# Patient Record
Sex: Female | Born: 1975 | Hispanic: No | Smoking: Never smoker
Health system: Southern US, Community
[De-identification: ages and names within clinical notes are randomized; demographics above are authoritative.]

## PROBLEM LIST (undated history)

## (undated) DIAGNOSIS — Z789 Other specified health status: Secondary | ICD-10-CM

## (undated) DIAGNOSIS — I10 Essential (primary) hypertension: Secondary | ICD-10-CM

## (undated) HISTORY — DX: Essential (primary) hypertension: I10

## (undated) HISTORY — PX: APPENDECTOMY: SHX54

## (undated) HISTORY — PX: ANKLE SURGERY: SHX546

---

## 2008-04-27 ENCOUNTER — Other Ambulatory Visit: Admission: RE | Admit: 2008-04-27 | Discharge: 2008-04-27 | Payer: Self-pay | Admitting: Family Medicine

## 2009-01-11 ENCOUNTER — Other Ambulatory Visit: Admission: RE | Admit: 2009-01-11 | Discharge: 2009-01-11 | Payer: Self-pay | Admitting: Family Medicine

## 2010-01-18 ENCOUNTER — Other Ambulatory Visit: Admission: RE | Admit: 2010-01-18 | Discharge: 2010-01-18 | Payer: Self-pay | Admitting: *Deleted

## 2011-04-03 ENCOUNTER — Other Ambulatory Visit (HOSPITAL_COMMUNITY)
Admission: RE | Admit: 2011-04-03 | Discharge: 2011-04-03 | Disposition: A | Discharge status: Home / Self Care | Payer: BC Managed Care – PPO | Source: Ambulatory Visit | Attending: Internal Medicine | Admitting: Internal Medicine

## 2011-04-03 ENCOUNTER — Other Ambulatory Visit: Payer: Self-pay

## 2011-04-03 DIAGNOSIS — Z01419 Encounter for gynecological examination (general) (routine) without abnormal findings: Secondary | ICD-10-CM | POA: Insufficient documentation

## 2011-06-16 ENCOUNTER — Other Ambulatory Visit: Payer: Self-pay | Admitting: Family Medicine

## 2011-06-16 ENCOUNTER — Ambulatory Visit
Admission: RE | Admit: 2011-06-16 | Discharge: 2011-06-16 | Disposition: A | Discharge status: Home / Self Care | Payer: BC Managed Care – PPO | Source: Ambulatory Visit | Attending: Family Medicine | Admitting: Family Medicine

## 2011-06-16 DIAGNOSIS — R609 Edema, unspecified: Secondary | ICD-10-CM

## 2011-06-16 DIAGNOSIS — X501XXA Overexertion from prolonged static or awkward postures, initial encounter: Secondary | ICD-10-CM

## 2011-06-16 DIAGNOSIS — R52 Pain, unspecified: Secondary | ICD-10-CM

## 2012-07-08 ENCOUNTER — Other Ambulatory Visit: Payer: Self-pay | Admitting: Family Medicine

## 2012-07-08 ENCOUNTER — Other Ambulatory Visit (HOSPITAL_COMMUNITY)
Admission: RE | Admit: 2012-07-08 | Discharge: 2012-07-08 | Disposition: A | Discharge status: Home / Self Care | Payer: BC Managed Care – PPO | Source: Ambulatory Visit | Attending: Family Medicine | Admitting: Family Medicine

## 2012-07-08 DIAGNOSIS — Z0000 Encounter for general adult medical examination without abnormal findings: Secondary | ICD-10-CM | POA: Insufficient documentation

## 2014-09-25 ENCOUNTER — Other Ambulatory Visit (HOSPITAL_COMMUNITY)
Admission: RE | Admit: 2014-09-25 | Discharge: 2014-09-25 | Disposition: A | Discharge status: Home / Self Care | Payer: BC Managed Care – PPO | Source: Ambulatory Visit | Attending: Family | Admitting: Family

## 2014-09-25 ENCOUNTER — Other Ambulatory Visit: Payer: Self-pay

## 2014-09-25 DIAGNOSIS — Z1151 Encounter for screening for human papillomavirus (HPV): Secondary | ICD-10-CM | POA: Diagnosis present

## 2014-09-25 DIAGNOSIS — Z01411 Encounter for gynecological examination (general) (routine) with abnormal findings: Secondary | ICD-10-CM | POA: Diagnosis present

## 2014-09-27 LAB — CYTOLOGY - PAP

## 2016-06-12 ENCOUNTER — Encounter: Payer: Self-pay | Admitting: Obstetrics & Gynecology

## 2017-01-29 ENCOUNTER — Other Ambulatory Visit: Payer: Self-pay | Admitting: Physician Assistant

## 2017-04-01 ENCOUNTER — Ambulatory Visit (INDEPENDENT_AMBULATORY_CARE_PROVIDER_SITE_OTHER): Payer: 59 | Admitting: Obstetrics & Gynecology

## 2017-04-01 ENCOUNTER — Encounter: Payer: Self-pay | Admitting: Obstetrics & Gynecology

## 2017-04-01 VITALS — BP 120/76 | Ht 65.0 in | Wt 143.0 lb

## 2017-04-01 DIAGNOSIS — Z9189 Other specified personal risk factors, not elsewhere classified: Secondary | ICD-10-CM | POA: Diagnosis not present

## 2017-04-01 DIAGNOSIS — Z01419 Encounter for gynecological examination (general) (routine) without abnormal findings: Secondary | ICD-10-CM | POA: Diagnosis not present

## 2017-04-01 DIAGNOSIS — Z1151 Encounter for screening for human papillomavirus (HPV): Secondary | ICD-10-CM

## 2017-04-01 NOTE — Patient Instructions (Signed)
1. Encounter for routine gynecological examination with Papanicolaou smear of cervix Normal Gyn exam.  H/O LGSIL.  Pap/HPV done.  Will schedule screening Mammo 06/2017 at [facility].  2. Relies on partner vasectomy for contraception    Zoe Fuller, it was a pleasure to see you today!  I will inform you of your results as soon as available.

## 2017-04-01 NOTE — Addendum Note (Signed)
Addended by: Thurnell Garbe A on: 04/01/2017 09:56 AM   Modules accepted: Orders

## 2017-04-01 NOTE — Progress Notes (Signed)
Zoe Fuller October 14, 1976 [id number]   History:    41 y.o.  G2P2 remarried x 3 yrs.  Vasectomy.  Children 10 and 22 yo.  2 step children as well  RP:  Established patient presenting for annual gyn exam   Saw an Integrative [facility] physician who Dxed and Rxed her for systemic Candida.  Feeling better now.  Menses qmonth flow heavier for a while, now back to normal.  No pelvic pain.  Breasts wnl.  H/O LGSIL.  Past medical history,surgical history, family history and social history were all reviewed and documented in the EPIC chart.  Gynecologic History Patient's last menstrual period was 03/08/2017. Contraception: vasectomy Last Pap: 06/2016. Results were: normal, HPV HR neg Last mammogram: 06/2016. Results were: normal  Obstetric History OB History  Gravida Para Term Preterm AB Living  2 2       2   SAB TAB Ectopic Multiple Live Births               # Outcome Date GA Lbr Len/2nd Weight Sex Delivery Anes PTL Lv  2 Para           1 Para                ROS: A ROS was performed and pertinent positives and negatives are included in the history.  GENERAL: No fevers or chills. HEENT: No change in vision, no earache, sore throat or sinus congestion. NECK: No pain or stiffness. CARDIOVASCULAR: No chest pain or pressure. No palpitations. PULMONARY: No shortness of breath, cough or wheeze. GASTROINTESTINAL: No abdominal pain, nausea, vomiting or diarrhea, melena or bright red blood per rectum. GENITOURINARY: No urinary frequency, urgency, hesitancy or dysuria. MUSCULOSKELETAL: No joint or muscle pain, no back pain, no recent trauma. DERMATOLOGIC: No rash, no itching, no lesions. ENDOCRINE: No polyuria, polydipsia, no heat or cold intolerance. No recent change in weight. HEMATOLOGICAL: No anemia or easy bruising or bleeding. NEUROLOGIC: No headache, seizures, numbness, tingling or weakness. PSYCHIATRIC: No depression, no loss of interest in normal activity or change in sleep pattern.     Exam:   BP 120/76   Ht 5\' 5"  (1.651 m)   Wt 143 lb (64.9 kg)   LMP 03/08/2017   BMI 23.80 kg/m   Body mass index is 23.8 kg/m.  General appearance : Well developed well nourished female. No acute distress HEENT: Eyes: no retinal hemorrhage or exudates,  Neck supple, trachea midline, no carotid bruits, no thyroidmegaly Lungs: Clear to auscultation, no rhonchi or wheezes, or rib retractions  Heart: Regular rate and rhythm, no murmurs or gallops Breast:Examined in sitting and supine position were symmetrical in appearance, no palpable masses or tenderness,  no skin retraction, no nipple inversion, no nipple discharge, no skin discoloration, no axillary or supraclavicular lymphadenopathy Abdomen: no palpable masses or tenderness, no rebound or guarding Extremities: no edema or skin discoloration or tenderness  Pelvic:  Bartholin, Urethra, Skene Glands: Within normal limits             Vagina: No gross lesions or discharge  Cervix: No gross lesions or discharge.  Pap/HPV done.  Uterus  RV, normal size, shape and consistency, non-tender and mobile  Adnexa  Without masses or tenderness  Anus and perineum  normal    Assessment/Plan:  41 y.o. female for annual exam  1. Encounter for routine gynecological examination with Papanicolaou smear of cervix Normal Gyn exam.  H/O LGSIL.  Pap/HPV done.  [facility]  2. Relies on partner vasectomy for contraception    Princess Bruins MD, 8:48 AM 04/01/2017

## 2017-04-07 LAB — PAP, TP IMAGING W/ HPV RNA, RFLX HPV TYPE 16,18/45: HPV MRNA, HIGH RISK: NOT DETECTED

## 2017-04-08 ENCOUNTER — Encounter: Payer: Self-pay | Admitting: Obstetrics & Gynecology

## 2017-04-30 ENCOUNTER — Encounter: Payer: Self-pay | Admitting: Obstetrics & Gynecology

## 2017-06-03 ENCOUNTER — Other Ambulatory Visit: Payer: Self-pay | Admitting: Obstetrics & Gynecology

## 2017-06-03 DIAGNOSIS — Z1231 Encounter for screening mammogram for malignant neoplasm of breast: Secondary | ICD-10-CM

## 2017-06-17 ENCOUNTER — Ambulatory Visit
Admission: RE | Admit: 2017-06-17 | Discharge: 2017-06-17 | Disposition: A | Discharge status: Home / Self Care | Payer: 59 | Source: Ambulatory Visit | Attending: Obstetrics & Gynecology | Admitting: Obstetrics & Gynecology

## 2017-06-17 DIAGNOSIS — Z1231 Encounter for screening mammogram for malignant neoplasm of breast: Secondary | ICD-10-CM

## 2018-04-02 ENCOUNTER — Ambulatory Visit (INDEPENDENT_AMBULATORY_CARE_PROVIDER_SITE_OTHER): Payer: BLUE CROSS/BLUE SHIELD | Admitting: Obstetrics & Gynecology

## 2018-04-02 ENCOUNTER — Encounter: Payer: Self-pay | Admitting: Obstetrics & Gynecology

## 2018-04-02 VITALS — BP 120/78 | Ht 65.75 in | Wt 135.0 lb

## 2018-04-02 DIAGNOSIS — Z01419 Encounter for gynecological examination (general) (routine) without abnormal findings: Secondary | ICD-10-CM

## 2018-04-02 DIAGNOSIS — Z9189 Other specified personal risk factors, not elsewhere classified: Secondary | ICD-10-CM | POA: Diagnosis not present

## 2018-04-02 NOTE — Addendum Note (Signed)
Addended by: Thurnell Garbe A on: 04/02/2018 09:42 AM   Modules accepted: Orders

## 2018-04-02 NOTE — Patient Instructions (Signed)
1. Encounter for routine gynecological examination with Papanicolaou smear of cervix Normal gynecologic exam.  Pap reflex done.  History of low-grade SIL, but last year's Pap test was negative with negative high risk HPV.  Breast exam normal.  Last mammogram negative in August 2018.  Good body mass index at 21.96.  Continue with regular physical activity and good nutrition.  First blood pressure today was borderline increased, but second blood pressure normal at 120/78.  Health labs with integrative medicine.  Patient will establish with a family physician.  2. Relies on partner vasectomy for contraception  Malayiah, it was a pleasure seeing you today!  I will inform you of your results as soon as they are available.

## 2018-04-02 NOTE — Progress Notes (Signed)
Zoe Fuller [birth year] [id number]   History:    42 y.o. G2P2L2 Remarried x 4 years.  Vasectomy.  Children 68 and 91 yo.  Has 2 stepchildren as well.  RP:  Established patient presenting for annual gyn exam   HPI: Menses regular normal every month.  No pelvic pain.  H/O LGSIL, but last Pap negative/HPV HR neg 03/2017.  Normal vaginal secretions.  No pain with intercourse.  Urine and bowel movements normal.  Breasts normal.  Body mass index 21.96.  Fit and eating well.  Seeing an integrative medicine physician.  Labs showed low vitamin D.  Patient taking supplements.  Will find a family physician as well.  Past medical history,surgical history, family history and social history were all reviewed and documented in the EPIC chart.  Gynecologic History Patient's last menstrual period was 03/12/2018. Contraception: vasectomy Last Pap: 03/2017. Results were: Negative/HPV HR neg Last mammogram: 06/2017. Results were: Negative Bone Density: Never Colonoscopy: Never  Obstetric History OB History  Gravida Para Term Preterm AB Living  2 2       2   SAB TAB Ectopic Multiple Live Births               # Outcome Date GA Lbr Len/2nd Weight Sex Delivery Anes PTL Lv  2 Para           1 Para              ROS: A ROS was performed and pertinent positives and negatives are included in the history.  GENERAL: No fevers or chills. HEENT: No change in vision, no earache, sore throat or sinus congestion. NECK: No pain or stiffness. CARDIOVASCULAR: No chest pain or pressure. No palpitations. PULMONARY: No shortness of breath, cough or wheeze. GASTROINTESTINAL: No abdominal pain, nausea, vomiting or diarrhea, melena or bright red blood per rectum. GENITOURINARY: No urinary frequency, urgency, hesitancy or dysuria. MUSCULOSKELETAL: No joint or muscle pain, no back pain, no recent trauma. DERMATOLOGIC: No rash, no itching, no lesions. ENDOCRINE: No polyuria, polydipsia, no heat or cold intolerance. No  recent change in weight. HEMATOLOGICAL: No anemia or easy bruising or bleeding. NEUROLOGIC: No headache, seizures, numbness, tingling or weakness. PSYCHIATRIC: No depression, no loss of interest in normal activity or change in sleep pattern.     Exam:   BP (!) 146/88   Ht 5' 5.75" (1.67 m)   Wt 135 lb (61.2 kg)   LMP 03/12/2018 Comment: pt's husband with vasectomy  BMI 21.96 kg/m   Body mass index is 21.96 kg/m.  General appearance : Well developed well nourished female. No acute distress HEENT: Eyes: no retinal hemorrhage or exudates,  Neck supple, trachea midline, no carotid bruits, no thyroidmegaly Lungs: Clear to auscultation, no rhonchi or wheezes, or rib retractions  Heart: Regular rate and rhythm, no murmurs or gallops Breast:Examined in sitting and supine position were symmetrical in appearance, no palpable masses or tenderness,  no skin retraction, no nipple inversion, no nipple discharge, no skin discoloration, no axillary or supraclavicular lymphadenopathy Abdomen: no palpable masses or tenderness, no rebound or guarding Extremities: no edema or skin discoloration or tenderness  Pelvic: Vulva: Normal             Vagina: No gross lesions or discharge  Cervix: No gross lesions or discharge.  Pap reflex done.  Uterus  RV, normal size, shape and consistency, non-tender and mobile  Adnexa  Without masses or tenderness  Anus: Normal   Assessment/Plan:  42 y.o. female for  annual exam   1. Encounter for routine gynecological examination with Papanicolaou smear of cervix Normal gynecologic exam.  Pap reflex done.  History of low-grade SIL, but last year's Pap test was negative with negative high risk HPV.  Breast exam normal.  Last mammogram negative in August 2018.  Good body mass index at 21.96.  Continue with regular physical activity and good nutrition.  First blood pressure today was borderline increased, but second blood pressure normal at 120/78.  Health labs with  integrative medicine.  Patient will establish with a family physician.  2. Relies on partner vasectomy for contraception   Princess Bruins MD, 9:08 AM 04/02/2018

## 2018-04-06 LAB — PAP IG W/ RFLX HPV ASCU

## 2018-06-18 ENCOUNTER — Other Ambulatory Visit: Payer: Self-pay | Admitting: Family Medicine

## 2018-06-18 ENCOUNTER — Other Ambulatory Visit: Payer: Self-pay | Admitting: Obstetrics & Gynecology

## 2018-06-18 DIAGNOSIS — Z1231 Encounter for screening mammogram for malignant neoplasm of breast: Secondary | ICD-10-CM

## 2018-07-20 ENCOUNTER — Ambulatory Visit
Admission: RE | Admit: 2018-07-20 | Discharge: 2018-07-20 | Disposition: A | Discharge status: Home / Self Care | Payer: 59 | Source: Ambulatory Visit | Attending: Obstetrics & Gynecology | Admitting: Obstetrics & Gynecology

## 2018-07-20 DIAGNOSIS — Z1231 Encounter for screening mammogram for malignant neoplasm of breast: Secondary | ICD-10-CM

## 2019-04-05 ENCOUNTER — Other Ambulatory Visit: Payer: Self-pay

## 2019-04-06 ENCOUNTER — Encounter: Payer: Self-pay | Admitting: Obstetrics & Gynecology

## 2019-04-06 ENCOUNTER — Ambulatory Visit (INDEPENDENT_AMBULATORY_CARE_PROVIDER_SITE_OTHER): Payer: BLUE CROSS/BLUE SHIELD | Admitting: Obstetrics & Gynecology

## 2019-04-06 VITALS — BP 120/70 | Ht 65.5 in | Wt 136.5 lb

## 2019-04-06 DIAGNOSIS — R87612 Low grade squamous intraepithelial lesion on cytologic smear of cervix (LGSIL): Secondary | ICD-10-CM

## 2019-04-06 DIAGNOSIS — Z9189 Other specified personal risk factors, not elsewhere classified: Secondary | ICD-10-CM

## 2019-04-06 DIAGNOSIS — Z01419 Encounter for gynecological examination (general) (routine) without abnormal findings: Secondary | ICD-10-CM

## 2019-04-06 NOTE — Progress Notes (Signed)
Zoe Fuller Jun 18, 1976 [id number]   History:    43 y.o. G2P2L2 Married x 5 yrs. Vasectomy.  Children 15 and 61 yo.  2 stepchildren/grand-children.  RP:  Established patient presenting for annual gyn exam   HPI: Menses regular normal, with lighter menstrual flow.  No BTB.  Followed by [facility].  Taking supplements of Iron/Vit B12/Vit K/Vit C/Vit D/Mg++/Acetylcysteine/Cyanocobalamin/Pregnenolone.  No pelvic pain.  No pain with IC.  Vasectomy.  Urine/BMs normal.  Breasts normal.  BMI 22.37.  Enjoys hiking.  Health labs with Fam MD/Integrative [facility].  Past medical history,surgical history, family history and social history were all reviewed and documented in the EPIC chart.  Gynecologic History Patient's last menstrual period was 03/28/2019. Contraception: vasectomy Last Pap: 03/2018. Results were: Negative, but TZ cells absent. Last mammogram: 07/2018. Results were: Negative Bone Density: Never Colonoscopy: Never  Obstetric History OB History  Gravida Para Term Preterm AB Living  2 2       2   SAB TAB Ectopic Multiple Live Births               # Outcome Date GA Lbr Len/2nd Weight Sex Delivery Anes PTL Lv  2 Para           1 Para              ROS: A ROS was performed and pertinent positives and negatives are included in the history.  GENERAL: No fevers or chills. HEENT: No change in vision, no earache, sore throat or sinus congestion. NECK: No pain or stiffness. CARDIOVASCULAR: No chest pain or pressure. No palpitations. PULMONARY: No shortness of breath, cough or wheeze. GASTROINTESTINAL: No abdominal pain, nausea, vomiting or diarrhea, melena or bright red blood per rectum. GENITOURINARY: No urinary frequency, urgency, hesitancy or dysuria. MUSCULOSKELETAL: No joint or muscle pain, no back pain, no recent trauma. DERMATOLOGIC: No rash, no itching, no lesions. ENDOCRINE: No polyuria, polydipsia, no heat or cold intolerance. No recent change in weight. HEMATOLOGICAL: No  anemia or easy bruising or bleeding. NEUROLOGIC: No headache, seizures, numbness, tingling or weakness. PSYCHIATRIC: No depression, no loss of interest in normal activity or change in sleep pattern.     Exam:   BP 120/70   Ht 5' 5.5" (1.664 m)   Wt 136 lb 8 oz (61.9 kg)   LMP 03/28/2019   BMI 22.37 kg/m   Body mass index is 22.37 kg/m.  General appearance : Well developed well nourished female. No acute distress HEENT: Eyes: no retinal hemorrhage or exudates,  Neck supple, trachea midline, no carotid bruits, no thyroidmegaly Lungs: Clear to auscultation, no rhonchi or wheezes, or rib retractions  Heart: Regular rate and rhythm, no murmurs or gallops Breast:Examined in sitting and supine position were symmetrical in appearance, no palpable masses or tenderness,  no skin retraction, no nipple inversion, no nipple discharge, no skin discoloration, no axillary or supraclavicular lymphadenopathy Abdomen: no palpable masses or tenderness, no rebound or guarding Extremities: no edema or skin discoloration or tenderness  Pelvic: Vulva: Normal             Vagina: No gross lesions or discharge  Cervix: No gross lesions or discharge.  Pap reflex done.  Uterus  RV, normal size, shape and consistency, non-tender and mobile  Adnexa  Without masses or tenderness  Anus: Normal   Assessment/Plan:  43 y.o. female for annual exam   1. Encounter for routine gynecological examination with Papanicolaou smear of cervix Normal gynecologic exam.  Pap test in  May 2019 was negative but transition zone cells were absent, Pap reflex done today.  Breast exam normal.  Screening mammogram September 2019 was negative.  Very good body mass index at 22.37.  Continue with fitness and healthy nutrition.  Health labs with family physician.  2. Relies on partner vasectomy for contraception  3. 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  Pap reflex done today.  Other orders - levocetirizine (XYZAL) 5  MG tablet; Take 5 mg by mouth every evening.  Princess Bruins MD, 4:40 PM 04/06/2019

## 2019-04-07 ENCOUNTER — Encounter: Payer: BLUE CROSS/BLUE SHIELD | Admitting: Obstetrics & Gynecology

## 2019-04-08 ENCOUNTER — Encounter: Payer: Self-pay | Admitting: Obstetrics & Gynecology

## 2019-04-08 NOTE — Patient Instructions (Signed)
1. Encounter for routine gynecological examination with Papanicolaou smear of cervix Normal gynecologic exam.  Pap test in May 2019 was negative but transition zone cells were absent, Pap reflex done today.  Breast exam normal.  Screening mammogram September 2019 was negative.  Very good body mass index at 22.37.  Continue with fitness and healthy nutrition.  Health labs with family physician.  2. Relies on partner vasectomy for contraception  3. 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  Pap reflex done today.  Other orders - levocetirizine (XYZAL) 5 MG tablet; Take 5 mg by mouth every evening.  Zoe Fuller, it was a pleasure seeing you today!  I will inform you of your results as soon as they are available.

## 2019-04-12 LAB — PAP IG W/ RFLX HPV ASCU

## 2019-04-12 LAB — HUMAN PAPILLOMAVIRUS, HIGH RISK: HPV DNA High Risk: NOT DETECTED

## 2019-09-05 ENCOUNTER — Other Ambulatory Visit: Payer: Self-pay | Admitting: Obstetrics & Gynecology

## 2019-09-05 DIAGNOSIS — Z1231 Encounter for screening mammogram for malignant neoplasm of breast: Secondary | ICD-10-CM

## 2019-10-26 ENCOUNTER — Ambulatory Visit
Admission: RE | Admit: 2019-10-26 | Discharge: 2019-10-26 | Disposition: A | Discharge status: Home / Self Care | Payer: BC Managed Care – PPO | Source: Ambulatory Visit | Attending: Obstetrics & Gynecology | Admitting: Obstetrics & Gynecology

## 2019-10-26 ENCOUNTER — Other Ambulatory Visit: Payer: Self-pay

## 2019-10-26 DIAGNOSIS — Z1231 Encounter for screening mammogram for malignant neoplasm of breast: Secondary | ICD-10-CM

## 2020-02-23 ENCOUNTER — Other Ambulatory Visit: Payer: Self-pay

## 2020-02-23 MED ORDER — ALCLOMETASONE DIPROPIONATE 0.05 % EX CREA: TOPICAL_CREAM | Freq: Two times a day (BID) | CUTANEOUS | 8 refills | Status: DC | PRN

## 2020-04-05 ENCOUNTER — Other Ambulatory Visit: Payer: Self-pay

## 2020-04-06 ENCOUNTER — Encounter: Payer: Self-pay | Admitting: Obstetrics & Gynecology

## 2020-04-06 ENCOUNTER — Ambulatory Visit (INDEPENDENT_AMBULATORY_CARE_PROVIDER_SITE_OTHER): Payer: PRIVATE HEALTH INSURANCE | Admitting: Obstetrics & Gynecology

## 2020-04-06 VITALS — BP 120/70 | Ht 66.0 in | Wt 141.0 lb

## 2020-04-06 DIAGNOSIS — N926 Irregular menstruation, unspecified: Secondary | ICD-10-CM | POA: Diagnosis not present

## 2020-04-06 DIAGNOSIS — Z9189 Other specified personal risk factors, not elsewhere classified: Secondary | ICD-10-CM

## 2020-04-06 DIAGNOSIS — Z01419 Encounter for gynecological examination (general) (routine) without abnormal findings: Secondary | ICD-10-CM

## 2020-04-06 DIAGNOSIS — R87610 Atypical squamous cells of undetermined significance on cytologic smear of cervix (ASC-US): Secondary | ICD-10-CM | POA: Diagnosis not present

## 2020-04-06 NOTE — Patient Instructions (Signed)
  1. Encounter for routine gynecological examination with Papanicolaou smear of cervix Normal gynecologic exam.  ASCUS with negative high-risk HPV in May 2020, pap reflex done today.  Breast exam normal.  Screening mammogram December 2020 was negative.  Will do health labs with integrative medicine.  Very good body mass index at 22.76.  Continue with fitness and healthy nutrition.  2. ASCUS of cervix with negative high risk HPV Pap reflex done today.  3. Relies on partner vasectomy for contraception  4. Irregular menses Irregular menses with metrorrhagia for 3 cycles.  Will rule out thyroid dysfunction and hyperprolactinemia.  Follow-up with a pelvic ultrasound to rule out endometrial pathology such as polyps, fibroids endometrial hyperplasia and endometrial cancer. - TSH - Prolactin - US Transvaginal Non-OB; Future  Zoe Fuller, it was a pleasure seeing you today!  I will inform you of your results as soon as they are available.

## 2020-04-06 NOTE — Progress Notes (Signed)
Zoe Fuller [birth year] [id number]   History:    44 y.o. G2P2L2 Married x 6 yrs. Vasectomy.  Children 16 and 50 yo.  2 stepchildren/grand-children.  RP:  Established patient presenting for annual gyn exam   HPI: Menses monthly with light flow, but BTB x 3 months.  Followed by [facility].  Stopped supplements of Iron/Vit B12/Vit K/Vit C/Vit D/Mg++/Acetylcysteine/Cyanocobalamin/Pregnenolone.  No pelvic pain.  No pain with IC. Vasectomy.  Urine/BMs normal.  Breasts normal.  BMI 22.37.  Enjoys hiking.  Health labs with Fam MD/[facility]ative [facility].   Past medical history,surgical history, family history and social history were all reviewed and documented in the EPIC chart.  Gynecologic History Patient's last menstrual period was 03/23/2020.  Obstetric History OB History  Gravida Para Term Preterm AB Living  2 2       2   SAB TAB Ectopic Multiple Live Births               # Outcome Date GA Lbr Len/2nd Weight Sex Delivery Anes PTL Lv  2 Para           1 Para              ROS: A ROS was performed and pertinent positives and negatives are included in the history.  GENERAL: No fevers or chills. HEENT: No change in vision, no earache, sore throat or sinus congestion. NECK: No pain or stiffness. CARDIOVASCULAR: No chest pain or pressure. No palpitations. PULMONARY: No shortness of breath, cough or wheeze. GASTROINTESTINAL: No abdominal pain, nausea, vomiting or diarrhea, melena or bright red blood per rectum. GENITOURINARY: No urinary frequency, urgency, hesitancy or dysuria. MUSCULOSKELETAL: No joint or muscle pain, no back pain, no recent trauma. DERMATOLOGIC: No rash, no itching, no lesions. ENDOCRINE: No polyuria, polydipsia, no heat or cold intolerance. No recent change in weight. HEMATOLOGICAL: No anemia or easy bruising or bleeding. NEUROLOGIC: No headache, seizures, numbness, tingling or weakness. PSYCHIATRIC: No depression, no loss of interest in normal activity or change in  sleep pattern.     Exam:   04/06/20 8:12 AM    BP  120/70   Weight  141lb(64kg)   Height  5'6"(1.627m)   Other Vitals  BMI 22.76 kg/m2  BSA 1.73 m2  LMP 03/23/2020  Menstrual status: Having periods  OB/Gyn status reviewed 04/06/2020       General appearance : Well developed well nourished female. No acute distress HEENT: Eyes: no retinal hemorrhage or exudates,  Neck supple, trachea midline, no carotid bruits, no thyroidmegaly Lungs: Clear to auscultation, no rhonchi or wheezes, or rib retractions  Heart: Regular rate and rhythm, no murmurs or gallops Breast:Examined in sitting and supine position were symmetrical in appearance, no palpable masses or tenderness,  no skin retraction, no nipple inversion, no nipple discharge, no skin discoloration, no axillary or supraclavicular lymphadenopathy Abdomen: no palpable masses or tenderness, no rebound or guarding Extremities: no edema or skin discoloration or tenderness  Pelvic: Vulva: Normal             Vagina: No gross lesions or discharge  Cervix: No gross lesions or discharge.  Pap reflex done.  Uterus  AV, normal size, shape and consistency, non-tender and mobile  Adnexa  Without masses or tenderness  Anus: Normal   Assessment/Plan:  44 y.o. female for annual exam   1. Encounter for routine gynecological examination with Papanicolaou smear of cervix Normal gynecologic exam.  ASCUS with negative high-risk HPV in May 2020, pap reflex done today.  Breast exam normal.  Screening mammogram December 2020 was negative.  Will do health labs with integrative medicine.  Very good body mass index at 22.76.  Continue with fitness and healthy nutrition.  2. ASCUS of cervix with negative high risk HPV Pap reflex done today.  3. Relies on partner vasectomy for contraception  4. Irregular menses Irregular menses with metrorrhagia for 3 cycles.  Will rule out thyroid dysfunction and hyperprolactinemia.  Follow-up with a pelvic  ultrasound to rule out endometrial pathology such as polyps, fibroids endometrial hyperplasia and endometrial cancer. - TSH - Prolactin - US Transvaginal Non-OB; Future  Princess Bruins MD, 8:21 AM 04/06/2020

## 2020-04-07 LAB — PROLACTIN: Prolactin: 14.7 ng/mL

## 2020-04-07 LAB — TSH: TSH: 1.32 mIU/L

## 2020-04-16 LAB — HUMAN PAPILLOMAVIRUS, HIGH RISK: HPV DNA High Risk: DETECTED — AB

## 2020-04-16 LAB — PAP IG W/ RFLX HPV ASCU

## 2020-04-25 ENCOUNTER — Other Ambulatory Visit: Payer: Self-pay

## 2020-04-26 ENCOUNTER — Ambulatory Visit (INDEPENDENT_AMBULATORY_CARE_PROVIDER_SITE_OTHER): Payer: PRIVATE HEALTH INSURANCE | Admitting: Obstetrics & Gynecology

## 2020-04-26 ENCOUNTER — Ambulatory Visit (INDEPENDENT_AMBULATORY_CARE_PROVIDER_SITE_OTHER): Payer: PRIVATE HEALTH INSURANCE

## 2020-04-26 DIAGNOSIS — D219 Benign neoplasm of connective and other soft tissue, unspecified: Secondary | ICD-10-CM | POA: Diagnosis not present

## 2020-04-26 DIAGNOSIS — R87610 Atypical squamous cells of undetermined significance on cytologic smear of cervix (ASC-US): Secondary | ICD-10-CM

## 2020-04-26 DIAGNOSIS — D251 Intramural leiomyoma of uterus: Secondary | ICD-10-CM

## 2020-04-26 DIAGNOSIS — R87810 Cervical high risk human papillomavirus (HPV) DNA test positive: Secondary | ICD-10-CM | POA: Diagnosis not present

## 2020-04-26 DIAGNOSIS — N854 Malposition of uterus: Secondary | ICD-10-CM

## 2020-04-26 DIAGNOSIS — N926 Irregular menstruation, unspecified: Secondary | ICD-10-CM

## 2020-04-26 DIAGNOSIS — D252 Subserosal leiomyoma of uterus: Secondary | ICD-10-CM

## 2020-04-26 NOTE — Progress Notes (Signed)
    Kerly Mensah-Wilson Jul 19, 1976 [id number]        44 y.o.  G2P2L2 Married x 6 yrs.  Vasectomy.  RP: Irregular menses for Pelvic US  HPI: Menses monthly with light flow, but BTB x 3 months.  Not bleeding today.  No pelvic pain currently.  No abnormal vaginal d/c.  No fever.  Pap 03/2020 ASCUS/HPV HR Positive.   OB History  Gravida Para Term Preterm AB Living  2 2       2   SAB TAB Ectopic Multiple Live Births               # Outcome Date GA Lbr Len/2nd Weight Sex Delivery Anes PTL Lv  2 Para           1 Para             Past medical history,surgical history, problem list, medications, allergies, family history and social history were all reviewed and documented in the EPIC chart.   Directed ROS with pertinent positives and negatives documented in the history of present illness/assessment and plan.  Exam:  There were no vitals filed for this visit. General appearance:  Normal  Pelvic US today: T/V images.  Retroverted uterus mildly enlarged and bulky with multiple fibroids, intramural and subserosal.  The uterus is measured at 7.41 x 6.33 x 2.33 cm.  5 fibroids are measured which are all below 2 cm.  The endometrial lining is thin and symmetrical, measured at 2.89 mm with no mass or thickening seen.  Both ovaries are normal in size with no follicles visualized.  No adnexal mass.  No free fluid in the posterior cul-de-sac.  Pap 04/06/2020 ASCUS/HPV HR Positive   Assessment/Plan:  44 y.o. G2P2   1. Irregular menses Pelvic ultrasound findings thoroughly reviewed with patient.  Small fibroids present but normal thin endometrial lining measured at 2.89 mm.  Ovaries normal.  Patient reassured.  Decision to observe.  2. Fibroids Small intramural and subserosal myoma.  3. ASCUS with positive high risk HPV cervical Pap test results discussed with patient.  Significance of high risk HPV positive reviewed.  Follow-up for colposcopy.  Procedure explained.  Princess Bruins MD,  9:19 AM 04/26/2020

## 2020-04-30 ENCOUNTER — Encounter: Payer: Self-pay | Admitting: Obstetrics & Gynecology

## 2020-04-30 NOTE — Patient Instructions (Signed)
1. Irregular menses Pelvic ultrasound findings thoroughly reviewed with patient.  Small fibroids present but normal thin endometrial lining measured at 2.89 mm.  Ovaries normal.  Patient reassured.  Decision to observe.  2. Fibroids Small intramural and subserosal myoma.  3. ASCUS with positive high risk HPV cervical Pap test results discussed with patient.  Significance of high risk HPV positive reviewed.  Follow-up for colposcopy.  Procedure explained.  Zoe Fuller, it was a pleasure seeing you today!

## 2020-05-15 ENCOUNTER — Ambulatory Visit (INDEPENDENT_AMBULATORY_CARE_PROVIDER_SITE_OTHER): Payer: PRIVATE HEALTH INSURANCE | Admitting: Obstetrics & Gynecology

## 2020-05-15 ENCOUNTER — Encounter: Payer: Self-pay | Admitting: Obstetrics & Gynecology

## 2020-05-15 ENCOUNTER — Other Ambulatory Visit: Payer: Self-pay

## 2020-05-15 VITALS — BP 130/88

## 2020-05-15 DIAGNOSIS — R87810 Cervical high risk human papillomavirus (HPV) DNA test positive: Secondary | ICD-10-CM

## 2020-05-15 DIAGNOSIS — N870 Mild cervical dysplasia: Secondary | ICD-10-CM | POA: Diagnosis not present

## 2020-05-15 NOTE — Progress Notes (Signed)
    Zoe Fuller 02-13-1976 [id number]        44 y.o.  G2P2   RP: ASCUS/HPV HR positive for Colposcopy  HPI: Pap 04/06/2020 ASCUS/HPV HR positive.  Pap ASCUS with HPV HR negative in 2020.   OB History  Gravida Para Term Preterm AB Living  2 2       2   SAB TAB Ectopic Multiple Live Births               # Outcome Date GA Lbr Len/2nd Weight Sex Delivery Anes PTL Lv  2 Para           1 Para             Past medical history,surgical history, problem list, medications, allergies, family history and social history were all reviewed and documented in the EPIC chart.   Directed ROS with pertinent positives and negatives documented in the history of present illness/assessment and plan.  Exam:  Vitals:   05/15/20 1554  BP: 130/88   General appearance:  Normal  Colposcopy Procedure Note Zoe Fuller 05/15/2020  Indications:  ASCUS/HPV HR positive  Procedure Details  The risks and benefits of the procedure and Verbal informed consent obtained.  Speculum placed in vagina and excellent visualization of cervix achieved, cervix swabbed x 3 with acetic acid solution.  Findings:  Cervix colposcopy: Physical Exam Genitourinary:       Vaginal colposcopy: Normal  Vulvar colposcopy: Normal  Perirectal colposcopy: Normal  The cervix was sprayed with Hurricane before performing the cervical biopsies.  Specimens: HPV 16-18-45.  Cervical Bxs at 1, 5, 8 and 11 O'Clock.  Complications:  None, Silver Nitrate for hemostasis. . Plan:  Management per HPV 16-18-45 and Cervical Bx results.    Assessment/Plan:  44 y.o. G2P2   1. ASCUS with positive high risk HPV cervical ASCUS with high risk HPV positive in May 2021.  Colposcopy procedure reviewed and colposcopy findings discussed with patient.  Postprocedure precautions reviewed.  Management per HPV 16-18-45 and cervical pathology results. - HPV Type 16 and 18/45 RNA - Pathology Report  ([facility])  Princess Bruins MD, 44 4:12 PM 05/15/2020

## 2020-05-16 ENCOUNTER — Encounter: Payer: Self-pay | Admitting: Obstetrics & Gynecology

## 2020-05-16 NOTE — Patient Instructions (Signed)
1. ASCUS with positive high risk HPV cervical ASCUS with high risk HPV positive in May 2021.  Colposcopy procedure reviewed and colposcopy findings discussed with patient.  Postprocedure precautions reviewed.  Management per HPV 16-18-45 and cervical pathology results. - HPV Type 16 and 18/45 RNA - Pathology Report ([facility])  Zoe Fuller, it was a pleasure seeing you today!  I will inform you of your results as soon as they are available.

## 2020-05-18 LAB — TISSUE PATH REPORT

## 2020-05-18 LAB — PATHOLOGY REPORT

## 2020-05-23 LAB — HPV TYPE 16 AND 18/45 RNA
HPV Type 16 RNA: NOT DETECTED
HPV Type 18/45 RNA: NOT DETECTED

## 2021-03-06 ENCOUNTER — Other Ambulatory Visit: Payer: Self-pay | Admitting: Physician Assistant

## 2021-09-13 IMAGING — MG DIGITAL SCREENING BILAT W/ TOMO W/ CAD
8 series · 9 of 24 positions shown · non-contrast
Comparison: Previous exam(s).

CLINICAL DATA: Screening.

EXAM:
DIGITAL SCREENING BILATERAL MAMMOGRAM WITH TOMO AND CAD

[L CC synth-2D]
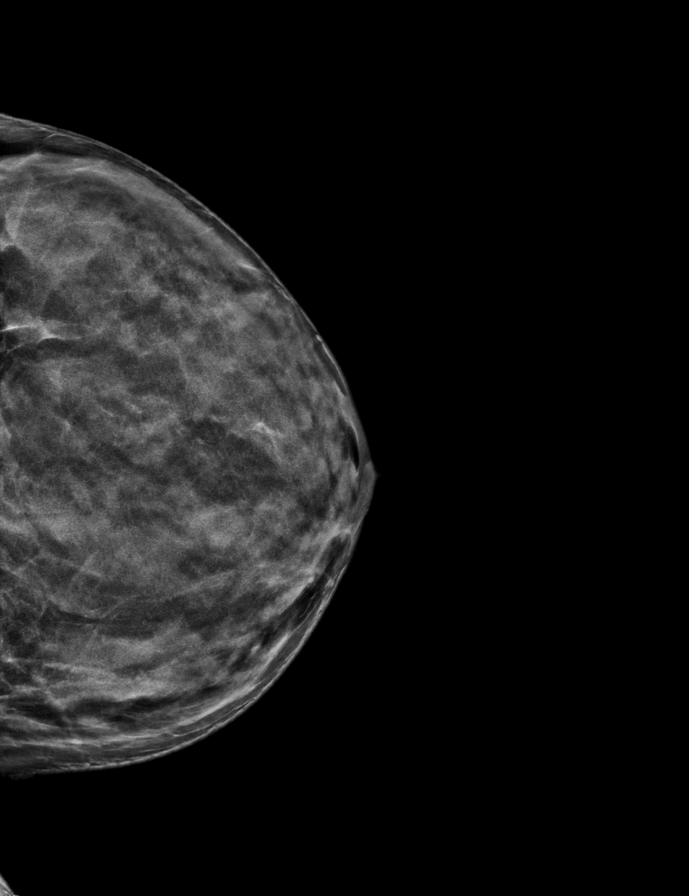

[R MLO synth-2D]
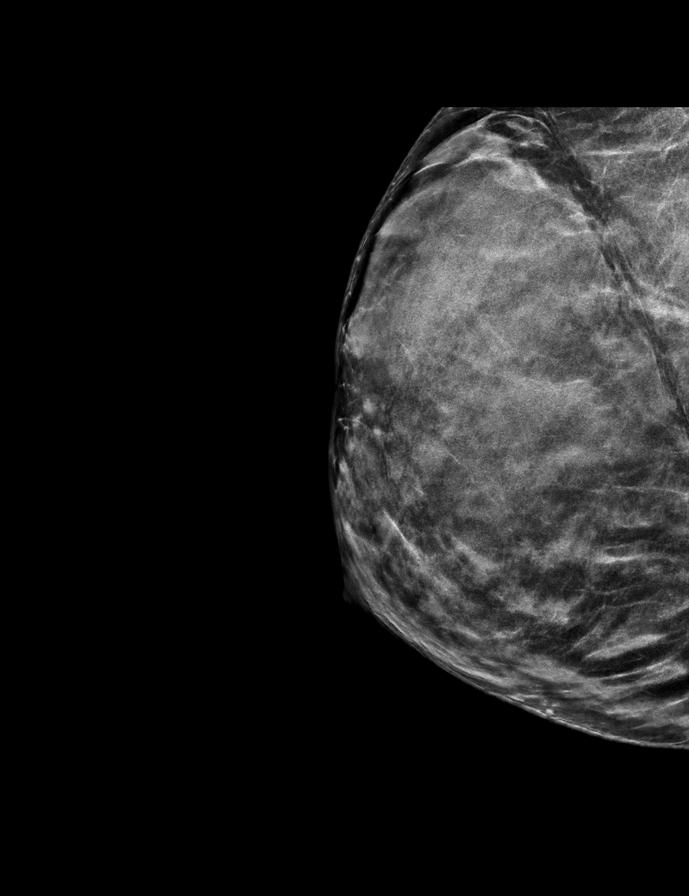

[R CC synth-2D]
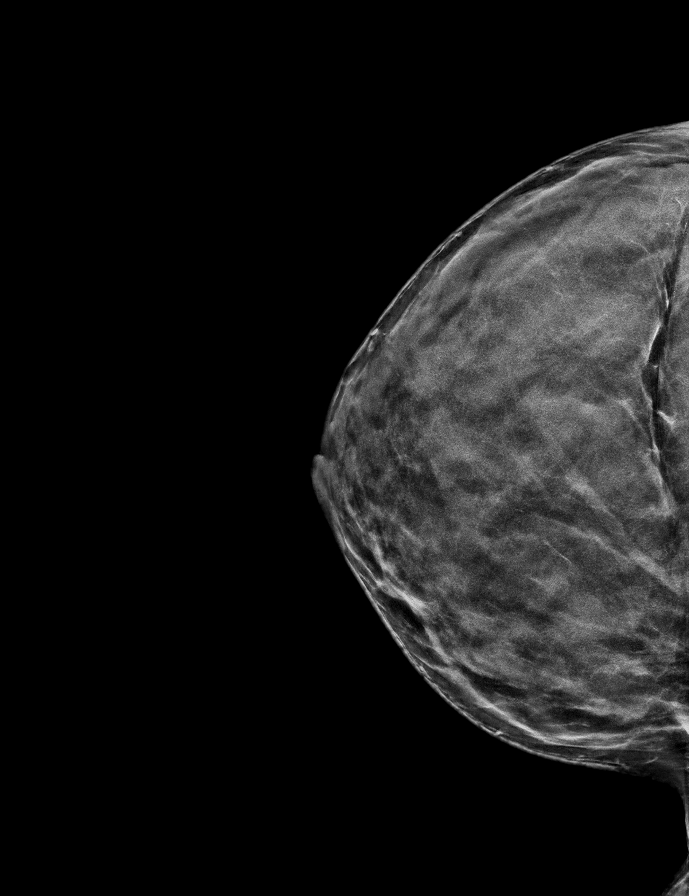

[L MLO synth-2D]
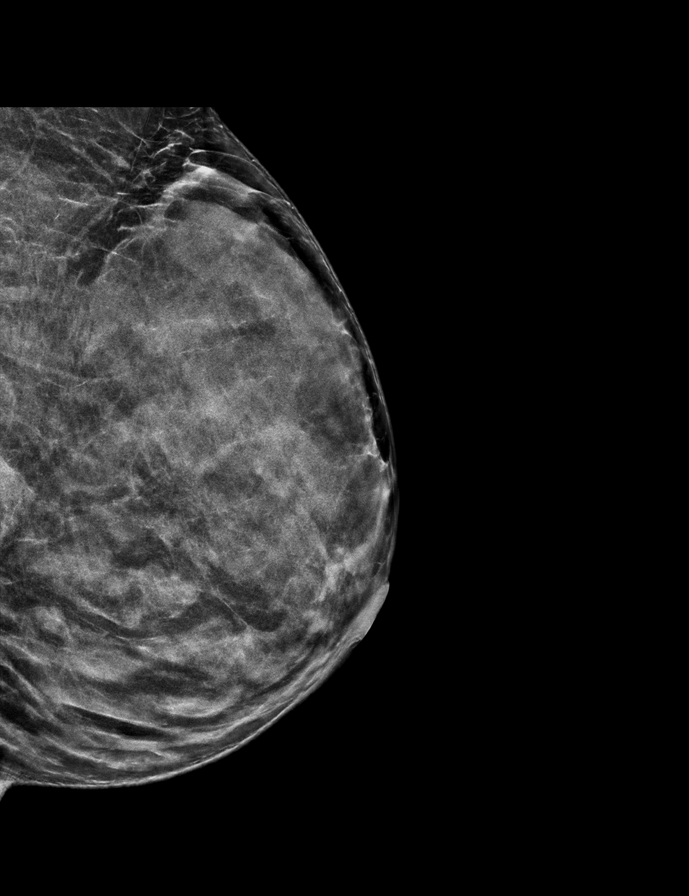

[R MLO tomo · 2 of 52 frames shown]
[frame 17/52]
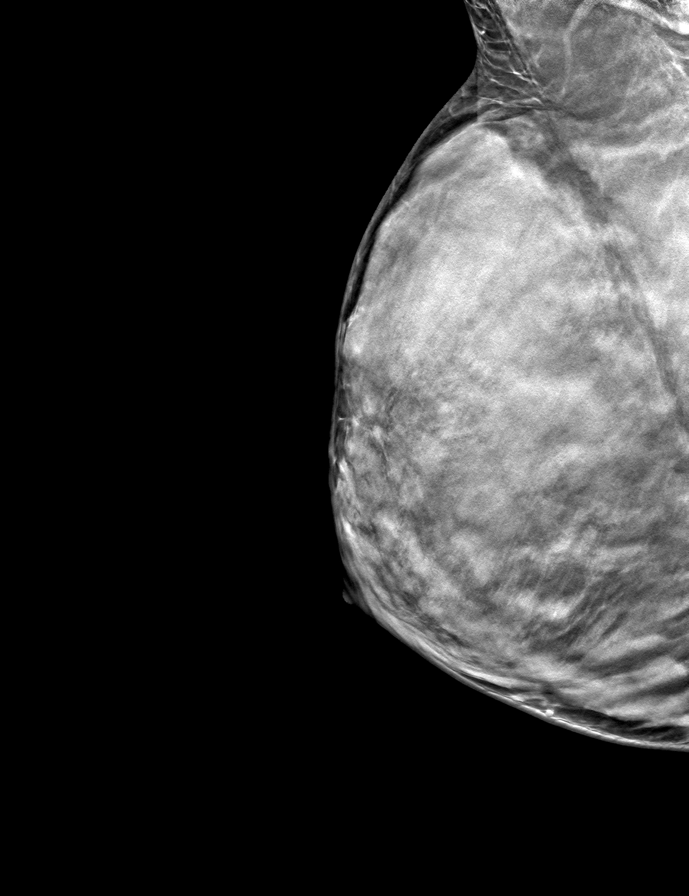
[frame 27/52]
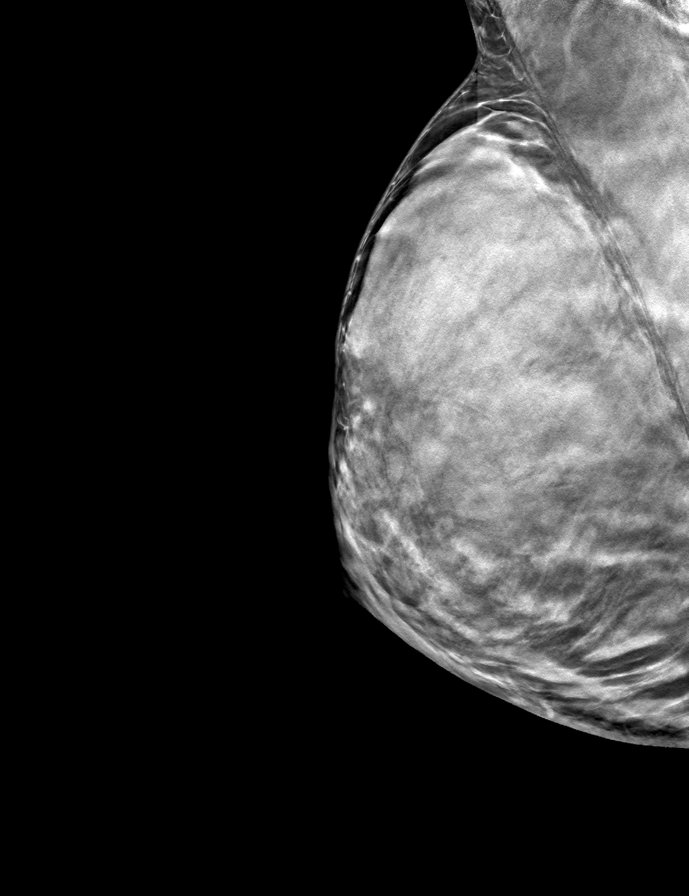

[R CC tomo · tomo slice 29/58.0]
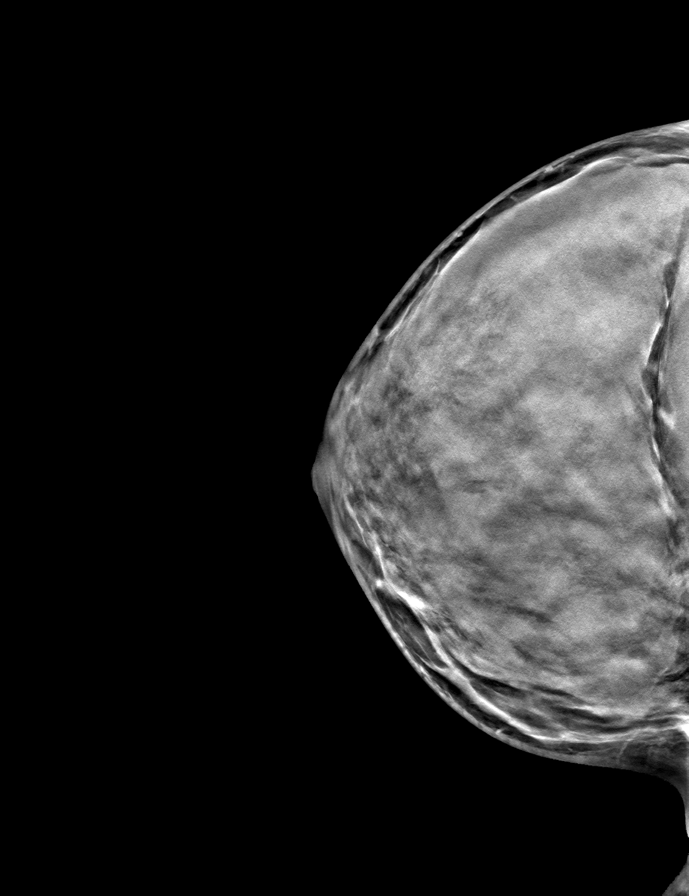

[L CC tomo · tomo slice 26/51.0]
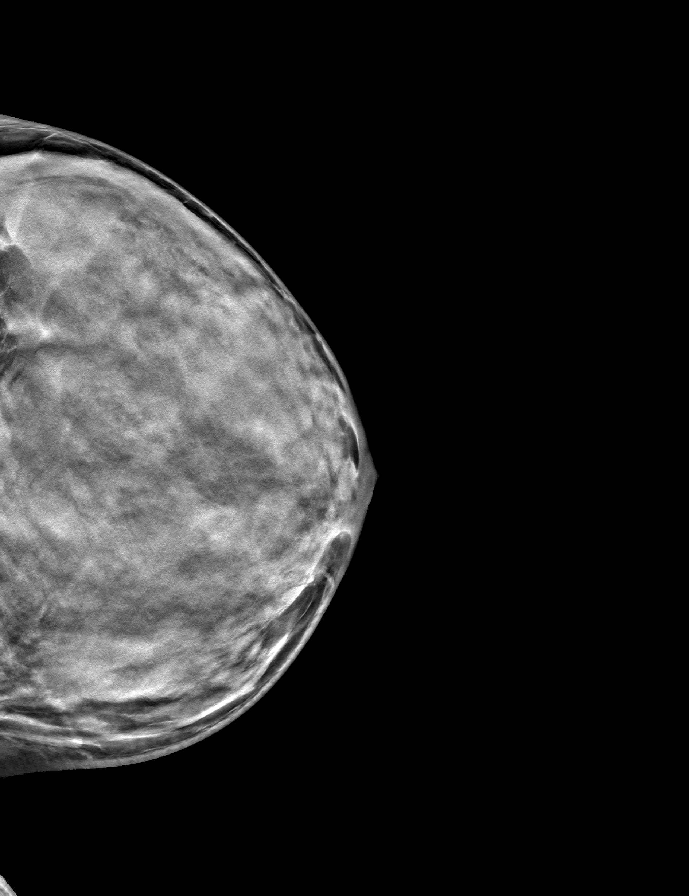

[L MLO tomo · tomo slice 25/50.0]
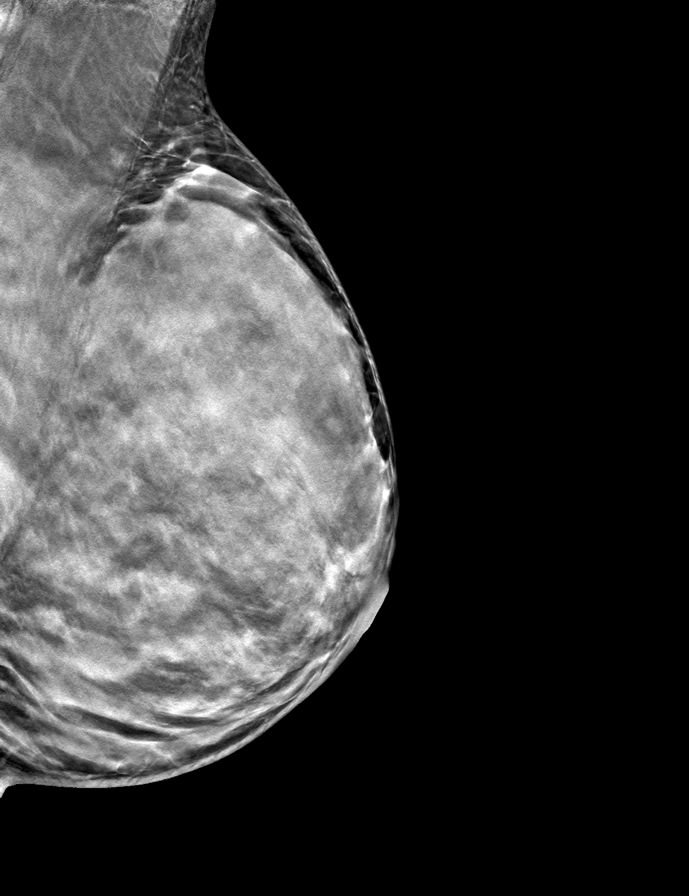

[9 of 24 positions shown; findings below may reference images not displayed]

ACR Breast Density Category d: The breast tissue is extremely dense,
which lowers the sensitivity of mammography
FINDINGS: There are no findings suspicious for malignancy. Images were
processed with CAD.
IMPRESSION: No mammographic evidence of malignancy. A result letter of this
screening mammogram will be mailed directly to the patient.

RECOMMENDATION:
Screening mammogram in one year. (Code:WO-[id number])

BI-RADS CATEGORY  1: Negative.

## 2021-12-03 ENCOUNTER — Encounter: Payer: Self-pay | Admitting: Physician Assistant

## 2021-12-03 ENCOUNTER — Ambulatory Visit (INDEPENDENT_AMBULATORY_CARE_PROVIDER_SITE_OTHER): Payer: No Typology Code available for payment source | Admitting: Physician Assistant

## 2021-12-03 ENCOUNTER — Other Ambulatory Visit: Payer: Self-pay

## 2021-12-03 DIAGNOSIS — L720 Epidermal cyst: Secondary | ICD-10-CM

## 2021-12-03 DIAGNOSIS — L308 Other specified dermatitis: Secondary | ICD-10-CM

## 2021-12-03 DIAGNOSIS — L708 Other acne: Secondary | ICD-10-CM | POA: Diagnosis not present

## 2021-12-03 DIAGNOSIS — L309 Dermatitis, unspecified: Secondary | ICD-10-CM

## 2021-12-03 DIAGNOSIS — D485 Neoplasm of uncertain behavior of skin: Secondary | ICD-10-CM

## 2021-12-03 MED ORDER — LEVOCETIRIZINE DIHYDROCHLORIDE 5 MG PO TABS: 5.0000 mg | ORAL_TABLET | Freq: Every evening | ORAL | 11 refills | Status: AC

## 2021-12-03 MED ORDER — TACROLIMUS 0.1 % EX OINT: TOPICAL_OINTMENT | Freq: Every day | CUTANEOUS | 11 refills | Status: DC

## 2021-12-03 NOTE — Patient Instructions (Signed)

## 2021-12-04 ENCOUNTER — Encounter: Payer: Self-pay | Admitting: Physician Assistant

## 2021-12-04 NOTE — Progress Notes (Signed)
New Patient   Subjective  Zoe Fuller is a 46 y.o. female who presents for the following: New Patient (Initial Visit) (Patient here today with daughter to have some blackheads removed on her back and face x years. No drainage, no bleeding, no pain, some itching at times.  Patient would also like to discuss thing to do for aging. No personal history or family history of atypical moles, melanoma or non mole skin cancer. ).   The following portions of the chart were reviewed this encounter and updated as appropriate:  Tobacco   Allergies   Meds   Problems   Med Hx   Surg Hx   Fam Hx       Objective  Well appearing patient in no apparent distress; mood and affect are within normal limits.  All skin waist up examined.  Mid Upper Back Nodule with central enlarged pore.   Right Mid Back Nodule with central enlarged pore.   Left Lower Cutaneous Lip Nodule with central enlarged pore.   Right Zygomatic Area Nodule with central enlarged pore.   Left Lower Eyelid, Left Upper Eyelid, Right Lower Eyelid, Right Upper Eyelid Xerosis and hyperpigmentation.    Assessment & Plan  Neoplasm of uncertain behavior of skin (4) Mid Upper Back  Skin / nail biopsy Type of biopsy: punch   Informed consent: discussed and consent obtained   Timeout: patient name, date of birth, surgical site, and procedure verified   Procedure prep:  Patient was prepped and draped in usual sterile fashion (Non sterile) Prep type:  Chlorhexidine Anesthesia: the lesion was anesthetized in a standard fashion   Anesthetic:  1% lidocaine w/ epinephrine 1-100,000 local infiltration Punch size:  3 mm Suture size:  4-0 Suture removal (days):  10 Hemostasis achieved with: suture   Outcome: patient tolerated procedure well   Post-procedure details: sterile dressing applied and wound care instructions given   Dressing type: bandage and petrolatum    Specimen 1 - Surgical pathology Differential Diagnosis:  R/O Cyst  Check Margins: No  Right Mid Back  Skin / nail biopsy Type of biopsy: punch   Informed consent: discussed and consent obtained   Timeout: patient name, date of birth, surgical site, and procedure verified   Procedure prep:  Patient was prepped and draped in usual sterile fashion (Non sterile) Prep type:  Chlorhexidine Anesthesia: the lesion was anesthetized in a standard fashion   Anesthetic:  1% lidocaine w/ epinephrine 1-100,000 local infiltration Punch size:  3 mm Suture size:  4-0 Suture removal (days):  10 Hemostasis achieved with: suture   Outcome: patient tolerated procedure well   Post-procedure details: sterile dressing applied and wound care instructions given   Dressing type: bandage and petrolatum    Specimen 2 - Surgical pathology Differential Diagnosis: R/O Cyst  Check Margins: No  Left Lower Cutaneous Lip  Skin / nail biopsy Type of biopsy: punch   Informed consent: discussed and consent obtained   Timeout: patient name, date of birth, surgical site, and procedure verified   Procedure prep:  Patient was prepped and draped in usual sterile fashion (Non sterile) Prep type:  Chlorhexidine Anesthesia: the lesion was anesthetized in a standard fashion   Anesthetic:  1% lidocaine w/ epinephrine 1-100,000 local infiltration Punch size:  3 mm Suture size:  5-0 Suture removal (days):  10 Hemostasis achieved with: suture   Outcome: patient tolerated procedure well   Post-procedure details: sterile dressing applied and wound care instructions given   Dressing type:  bandage and petrolatum    Specimen 3 - Surgical pathology Differential Diagnosis: R/O Cyst  Check Margins: No  Right Zygomatic Area  Skin / nail biopsy Type of biopsy: punch   Informed consent: discussed and consent obtained   Timeout: patient name, date of birth, surgical site, and procedure verified   Procedure prep:  Patient was prepped and draped in usual sterile fashion (Non  sterile) Prep type:  Chlorhexidine Anesthesia: the lesion was anesthetized in a standard fashion   Anesthetic:  1% lidocaine w/ epinephrine 1-100,000 local infiltration Punch size:  3 mm Suture size:  5-0 Suture removal (days):  10 Hemostasis achieved with: suture   Outcome: patient tolerated procedure well   Post-procedure details: sterile dressing applied and wound care instructions given   Dressing type: bandage and petrolatum    Specimen 4 - Surgical pathology Differential Diagnosis: R/O Cyst  Check Margins: No  Periocular dermatitis Left Upper Eyelid; Right Upper Eyelid; Left Lower Eyelid; Right Lower Eyelid  tacrolimus (PROTOPIC) 0.1 % ointment - Left Lower Eyelid, Left Upper Eyelid, Right Lower Eyelid, Right Upper Eyelid Apply topically at bedtime.  levocetirizine (XYZAL) 5 MG tablet - Left Lower Eyelid, Left Upper Eyelid, Right Lower Eyelid, Right Upper Eyelid Take 1 tablet (5 mg total) by mouth every evening.     I, Marsella Suman, PA-C, have reviewed all documentation's for this visit.  The documentation on 12/04/21 for the exam, diagnosis, procedures and orders are all accurate and complete.

## 2021-12-10 ENCOUNTER — Telehealth: Payer: Self-pay | Admitting: Physician Assistant

## 2021-12-10 DIAGNOSIS — L309 Dermatitis, unspecified: Secondary | ICD-10-CM

## 2021-12-10 NOTE — Telephone Encounter (Signed)
Patient left message on office voice mail that the ointment tacrolimus tacrolimus (PROTOPIC) 0.1 % ointment Was too expensive and patient wants to know what she needs to do.

## 2021-12-10 NOTE — Telephone Encounter (Signed)
[facility] cash pay 46.75 [facility] with [employer]

## 2021-12-11 ENCOUNTER — Telehealth: Payer: Self-pay | Admitting: Physician Assistant

## 2021-12-11 MED ORDER — TACROLIMUS 0.1 % EX OINT: TOPICAL_OINTMENT | Freq: Every day | CUTANEOUS | 11 refills | Status: DC

## 2021-12-11 NOTE — Addendum Note (Signed)
Addended by: Sheran Lawless on: 12/11/2021 10:49 AM   Modules accepted: Orders

## 2021-12-11 NOTE — Telephone Encounter (Signed)
Patient calling to inform clinical staff that she does want the rx for protopic sent to [facility]. Confirmed that she just needs coupon code from [facility] and to cash pay for this medication.

## 2021-12-11 NOTE — Telephone Encounter (Signed)
Phone call to patient to inform her that her prescription has already been sent to [facility] and she'll just need to tell the Pharmacist that she's using [facility] and paying cash.  Patient aware.

## 2021-12-11 NOTE — Telephone Encounter (Signed)
Patient aware rx sent to [facility]

## 2021-12-12 ENCOUNTER — Other Ambulatory Visit: Payer: Self-pay

## 2021-12-12 ENCOUNTER — Ambulatory Visit (INDEPENDENT_AMBULATORY_CARE_PROVIDER_SITE_OTHER): Payer: No Typology Code available for payment source

## 2021-12-12 DIAGNOSIS — Z4802 Encounter for removal of sutures: Secondary | ICD-10-CM

## 2021-12-12 MED ORDER — TACROLIMUS 0.1 % EX OINT: TOPICAL_OINTMENT | CUTANEOUS | 11 refills | Status: DC

## 2021-12-12 NOTE — Progress Notes (Signed)
Nurse visit x 4 suture removed no signs or symptoms of infection

## 2021-12-12 NOTE — Addendum Note (Signed)
Addended by: Sheran Lawless on: 12/12/2021 09:09 AM   Modules accepted: Orders

## 2021-12-17 ENCOUNTER — Telehealth: Payer: Self-pay | Admitting: Physician Assistant

## 2021-12-17 NOTE — Telephone Encounter (Signed)
Needs day supply of Tacrolimus for insurance

## 2021-12-17 NOTE — Telephone Encounter (Signed)
30 days supply per tube

## 2022-02-05 ENCOUNTER — Ambulatory Visit (INDEPENDENT_AMBULATORY_CARE_PROVIDER_SITE_OTHER): Payer: No Typology Code available for payment source | Admitting: Physician Assistant

## 2022-02-05 ENCOUNTER — Other Ambulatory Visit: Payer: Self-pay

## 2022-02-05 DIAGNOSIS — L720 Epidermal cyst: Secondary | ICD-10-CM

## 2022-02-11 ENCOUNTER — Ambulatory Visit: Payer: No Typology Code available for payment source

## 2022-02-24 ENCOUNTER — Encounter: Payer: Self-pay | Admitting: Physician Assistant

## 2022-02-24 NOTE — Progress Notes (Signed)
? ?  Follow-Up Visit ?  ?Subjective  ?Zoe Fuller is a 46 y.o. female who presents for the following: Skin Problem (Lesion on left side chin has come back. We removed it in January. Was an epidermoid cyst). ? ? ?The following portions of the chart were reviewed this encounter and updated as appropriate:  Tobacco  Allergies  Meds  Problems  Med Hx  Surg Hx  Fam Hx   ?  ? ?Objective  ?Well appearing patient in no apparent distress; mood and affect are within normal limits. ? ?A focused examination was performed including face. Relevant physical exam findings are noted in the Assessment and Plan. ? ?chin ?Small papule ? ? ?Assessment & Plan  ?Epidermal cyst ?chin ? ?observe ? ? ? ?I, Olevia Westervelt, PA-C, have reviewed all documentation's for this visit.  The documentation on 02/24/22 for the exam, diagnosis, procedures and orders are all accurate and complete. ?

## 2022-11-27 ENCOUNTER — Other Ambulatory Visit: Payer: Self-pay | Admitting: Internal Medicine

## 2022-11-27 DIAGNOSIS — Z1231 Encounter for screening mammogram for malignant neoplasm of breast: Secondary | ICD-10-CM

## 2023-01-16 ENCOUNTER — Ambulatory Visit
Admission: RE | Admit: 2023-01-16 | Discharge: 2023-01-16 | Disposition: A | Discharge status: Home / Self Care | Payer: No Typology Code available for payment source | Source: Ambulatory Visit | Attending: Internal Medicine | Admitting: Internal Medicine

## 2023-01-16 DIAGNOSIS — Z1231 Encounter for screening mammogram for malignant neoplasm of breast: Secondary | ICD-10-CM

## 2023-02-18 ENCOUNTER — Other Ambulatory Visit (HOSPITAL_COMMUNITY)
Admission: RE | Admit: 2023-02-18 | Discharge: 2023-02-18 | Disposition: A | Discharge status: Home / Self Care | Payer: No Typology Code available for payment source | Source: Ambulatory Visit | Attending: Obstetrics & Gynecology | Admitting: Obstetrics & Gynecology

## 2023-02-18 ENCOUNTER — Ambulatory Visit (INDEPENDENT_AMBULATORY_CARE_PROVIDER_SITE_OTHER): Payer: No Typology Code available for payment source | Admitting: Obstetrics & Gynecology

## 2023-02-18 ENCOUNTER — Encounter: Payer: Self-pay | Admitting: Obstetrics & Gynecology

## 2023-02-18 VITALS — BP 118/74 | HR 71 | Ht 65.25 in | Wt 160.0 lb

## 2023-02-18 DIAGNOSIS — Z113 Encounter for screening for infections with a predominantly sexual mode of transmission: Secondary | ICD-10-CM | POA: Diagnosis present

## 2023-02-18 DIAGNOSIS — Z3009 Encounter for other general counseling and advice on contraception: Secondary | ICD-10-CM

## 2023-02-18 DIAGNOSIS — Z01419 Encounter for gynecological examination (general) (routine) without abnormal findings: Secondary | ICD-10-CM

## 2023-02-18 NOTE — Progress Notes (Signed)
Zoe Fuller December 25, 1975 [id number]   History:    47 y.o. G2P2L2 Process of divorce. 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  Daughters 95 and 15+ yo.    RP:  Established patient presenting for annual gyn exam    HPI: Menses mostly monthly with light flow, occasionally spaces to 2 months, and then the flow is moderate.  No BTB.  No pelvic pain.  Abstinent x 04/2022.  Pap Neg 03/2020.  Pap/HPV HR, Gono-Chlam today. Breasts normal.  Mammo Neg 01/2023.  Colono 05/2022. BMI 26.42.  Lifting weights.  Followed by [facility].     Past medical history,surgical history, family history and social history were all reviewed and documented in the EPIC chart.  Gynecologic History Patient's last menstrual period was 01/06/2023.  Obstetric History OB History  Gravida Para Term Preterm AB Living  2 2 2     2   SAB IAB Ectopic Multiple Live Births               # Outcome Date GA Lbr Len/2nd Weight Sex Delivery Anes PTL Lv  2 Term           1 Term              ROS: A ROS was performed and pertinent positives and negatives are included in the history. GENERAL: No fevers or chills. HEENT: No change in vision, no earache, sore throat or sinus congestion. NECK: No pain or stiffness. CARDIOVASCULAR: No chest pain or pressure. No palpitations. PULMONARY: No shortness of breath, cough or wheeze. GASTROINTESTINAL: No abdominal pain, nausea, vomiting or diarrhea, melena or bright red blood per rectum. GENITOURINARY: No urinary frequency, urgency, hesitancy or dysuria. MUSCULOSKELETAL: No joint or muscle pain, no back pain, no recent trauma. DERMATOLOGIC: No rash, no itching, no lesions. ENDOCRINE: No polyuria, polydipsia, no heat or cold intolerance. No recent change in weight. HEMATOLOGICAL: No anemia or easy bruising or bleeding. NEUROLOGIC: No headache, seizures, numbness, tingling or weakness. PSYCHIATRIC: No depression, no loss of interest in normal activity or change in sleep pattern.     Exam:   BP  118/74   Pulse 71   Ht 5' 5.25" (1.657 m)   Wt 160 lb (72.6 kg)   LMP 01/06/2023 Comment: spotting march 16,2024 for 1 day, not sexually active since 6/23  SpO2 99%   BMI 26.42 kg/m   Body mass index is 26.42 kg/m.  General appearance : Well developed well nourished female. No acute distress HEENT: Eyes: no retinal hemorrhage or exudates,  Neck supple, trachea midline, no carotid bruits, no thyroidmegaly Lungs: Clear to auscultation, no rhonchi or wheezes, or rib retractions  Heart: Regular rate and rhythm, no murmurs or gallops Breast:Examined in sitting and supine position were symmetrical in appearance, no palpable masses or tenderness,  no skin retraction, no nipple inversion, no nipple discharge, no skin discoloration, no axillary or supraclavicular lymphadenopathy Abdomen: no palpable masses or tenderness, no rebound or guarding Extremities: no edema or skin discoloration or tenderness  Pelvic: Vulva: Normal             Vagina: No gross lesions or discharge  Cervix: No gross lesions or discharge.  Pap/HPV HR, Gono-Chlam.  Uterus  AV, normal size, shape and consistency, non-tender and mobile  Adnexa  Without masses or tenderness  Anus: Normal   Assessment/Plan:  47 y.o. female for annual exam   1. Encounter for routine gynecological examination with Papanicolaou smear of cervix Menses mostly monthly with light flow, occasionally  spaces to 2 months, and then the flow is moderate.  No BTB.  No pelvic pain.  Abstinent x 04/2022.  Pap Neg 03/2020.  Pap/HPV HR, Gono-Chlam today. Breasts normal.  Mammo Neg 01/2023.  Colono 05/2022. BMI 26.42.  Lifting weights.  Followed by [facility].   - Cytology - PAP( [place])  2. Encounter for other general counseling or advice on contraception Currently abstinent, declines contraception.  3. Screen for STD (sexually transmitted disease) - Cytology - PAP( [place]) with HPV HR, Gono-Chlam.  Other orders - Ferrous Sulfate (IRON PO);  Take by mouth. - Cyanocobalamin (B-12 PO); Take by mouth. - Cholecalciferol (VITAMIN D3 PO); Take by mouth. - Prasterone, DHEA, (DHEA PO); Take 5 mg by mouth. - Pregnenolone Micronized (PREGNENOLONE PO); Take by mouth. - CHLOROPHYLL PO; Take by mouth. - COLLAGEN PO; Take by mouth. - UNABLE TO FIND; Med Name: sea moss - FENUGREEK PO; Take by mouth.   Genia Del MD, 8:21 AM

## 2023-02-19 LAB — CYTOLOGY - PAP
Chlamydia: NEGATIVE
Comment: NEGATIVE
Comment: NEGATIVE
Comment: NORMAL
Diagnosis: NEGATIVE
High risk HPV: NEGATIVE
Neisseria Gonorrhea: NEGATIVE

## 2023-12-16 ENCOUNTER — Other Ambulatory Visit: Payer: Self-pay | Admitting: Internal Medicine

## 2023-12-16 DIAGNOSIS — Z1231 Encounter for screening mammogram for malignant neoplasm of breast: Secondary | ICD-10-CM

## 2024-01-04 ENCOUNTER — Emergency Department (HOSPITAL_COMMUNITY): Payer: Self-pay

## 2024-01-04 ENCOUNTER — Encounter (HOSPITAL_COMMUNITY): Payer: Self-pay

## 2024-01-04 ENCOUNTER — Emergency Department (HOSPITAL_COMMUNITY)
Admission: EM | Admit: 2024-01-04 | Discharge: 2024-01-04 | Disposition: A | Discharge status: Home / Self Care | Payer: Self-pay | Attending: Emergency Medicine | Admitting: Emergency Medicine

## 2024-01-04 ENCOUNTER — Other Ambulatory Visit: Payer: Self-pay

## 2024-01-04 DIAGNOSIS — I10 Essential (primary) hypertension: Secondary | ICD-10-CM | POA: Diagnosis not present

## 2024-01-04 DIAGNOSIS — Z79899 Other long term (current) drug therapy: Secondary | ICD-10-CM | POA: Diagnosis not present

## 2024-01-04 DIAGNOSIS — R55 Syncope and collapse: Secondary | ICD-10-CM | POA: Diagnosis present

## 2024-01-04 HISTORY — DX: Other specified health status: Z78.9

## 2024-01-04 LAB — BASIC METABOLIC PANEL
Anion gap: 8 (ref 5–15)
BUN: 10 mg/dL (ref 6–20)
CO2: 26 mmol/L (ref 22–32)
Calcium: 9.6 mg/dL (ref 8.9–10.3)
Chloride: 103 mmol/L (ref 98–111)
Creatinine, Ser: 1.06 mg/dL — ABNORMAL HIGH (ref 0.44–1.00)
GFR, Estimated: >60 mL/min (ref >60)
Glucose, Bld: 107 mg/dL — ABNORMAL HIGH (ref 70–99)
Potassium: 3.8 mmol/L (ref 3.5–5.1)
Sodium: 137 mmol/L (ref 135–145)

## 2024-01-04 LAB — TROPONIN I (HIGH SENSITIVITY): Troponin I (High Sensitivity): 10 ng/L (ref <18)

## 2024-01-04 LAB — CBC WITH DIFFERENTIAL/PLATELET
Abs Immature Granulocytes: 0.01 10*3/uL (ref 0.00–0.07)
Basophils Absolute: 0.1 10*3/uL (ref 0.0–0.1)
Basophils Relative: 1 %
Eosinophils Absolute: 0.2 10*3/uL (ref 0.0–0.5)
Eosinophils Relative: 3 %
HCT: 37.9 % (ref 36.0–46.0)
Hemoglobin: 12.7 g/dL (ref 12.0–15.0)
Immature Granulocytes: 0 %
Lymphocytes Relative: 45 %
Lymphs Abs: 2.5 10*3/uL (ref 0.7–4.0)
MCH: 30.4 pg (ref 26.0–34.0)
MCHC: 33.5 g/dL (ref 30.0–36.0)
MCV: 90.7 fL (ref 80.0–100.0)
Monocytes Absolute: 0.6 10*3/uL (ref 0.1–1.0)
Monocytes Relative: 11 %
Neutro Abs: 2.3 10*3/uL (ref 1.7–7.7)
Neutrophils Relative %: 40 %
Platelets: 396 10*3/uL (ref 150–400)
RBC: 4.18 MIL/uL (ref 3.87–5.11)
RDW: 11.9 % (ref 11.5–15.5)
WBC: 5.7 10*3/uL (ref 4.0–10.5)
nRBC: 0 % (ref 0.0–0.2)

## 2024-01-04 LAB — CBG MONITORING, ED: Glucose-Capillary: 108 mg/dL — ABNORMAL HIGH (ref 70–99)

## 2024-01-04 LAB — MAGNESIUM: Magnesium: 2 mg/dL (ref 1.7–2.4)

## 2024-01-04 LAB — HCG, SERUM, QUALITATIVE: Preg, Serum: NEGATIVE

## 2024-01-04 MED ORDER — HYDROCHLOROTHIAZIDE 25 MG PO TABS: 25.0000 mg | ORAL_TABLET | Freq: Every day | ORAL | 0 refills | Status: AC

## 2024-01-04 MED ORDER — IOHEXOL 350 MG/ML SOLN
120.0000 mL | Freq: Once | INTRAVENOUS | Status: AC | PRN
  Administered 2024-01-04: 120 mL via INTRAVENOUS

## 2024-01-04 NOTE — ED Notes (Signed)
 Back from CT obtaining orthostatics now

## 2024-01-04 NOTE — ED Provider Notes (Signed)
 [place] EMERGENCY DEPARTMENT AT [facility] Provider Note   CSN: [id number] Arrival date & time: 01/04/24  4098     History  Chief Complaint  Patient presents with   Near Syncope    Zoe Fuller is a 48 y.o. female.  HPI 48 year old female presents today with reports of syncope 3 times on Friday.  She was out of the country [place] 14 days returning on Tuesday.  She felt that she was well.  She was at work on Friday.  She had some abdominal cramping.  She had 3 episodes where she felt lightheaded and passed out.  She was awake again immediately afterwards.  She denies any injury from the fall.  She did not have any preceding chest pain or dyspnea.  She denies any pain, swelling, tenderness in her legs.  She has not any history of PE or DVT in the past.  She states she has back to baseline.  She reports that she was tired on Friday after she went home.  She had a coworker take her home.  She then slept for several hours.  She reports that she had 5 episodes of diarrhea over the weekend but no fever, chills, nausea, vomiting has been taking p.o. without difficulty.  The diarrhea has since resolved.  She has no complaints today.  She was seen and told to come to the ED.    Home Medications Prior to Admission medications   Medication Sig Start Date End Date Taking? Authorizing Provider  hydrochlorothiazide (HYDRODIURIL) 25 MG tablet Take 1 tablet (25 mg total) by mouth daily. 01/04/24  Yes Margarita Grizzle, MD  Ascorbic Acid (VITAMIN C) 100 MG tablet Take 100 mg by mouth daily.    [provider]  cetirizine (ZYRTEC) 10 MG tablet 1 tablet    [provider]  CHLOROPHYLL PO Take by mouth.    [provider]  Cholecalciferol (VITAMIN D3 PO) Take by mouth.    [provider]  COLLAGEN PO Take by mouth.    [provider]  Cyanocobalamin (B-12 PO) Take by mouth.    [provider]  FENUGREEK PO Take by mouth.    [provider]  Ferrous Sulfate (IRON PO) Take by mouth.    [provider]  levocetirizine (XYZAL) 5 MG tablet Take 1 tablet (5 mg total) by mouth every evening. Patient not taking: Reported on 02/18/2023 12/03/21   Mackey Birchwood R, PA-C  Prasterone, DHEA, (DHEA PO) Take 5 mg by mouth.    [provider]  Pregnenolone Micronized (PREGNENOLONE PO) Take by mouth.    [provider]  Saccharomyces boulardii (PROBIOTIC) 250 MG CAPS 2 capsules    [provider]  UNABLE TO FIND Med Name: sea moss    [provider]  VITAMIN K PO Take by mouth.    [provider]      Allergies    Patient has no known allergies.    Review of Systems   Review of Systems  Physical Exam Updated Vital Signs BP (!) 198/98   Pulse 64   Temp 98 F (36.7 C)   Resp 17   Ht 1.657 m (5' 5.25")   Wt 72.6 kg   SpO2 98%   BMI 26.43 kg/m  Physical Exam Vitals and nursing note reviewed.  Constitutional:      General: She is not in acute distress.    Appearance: She is well-developed.  HENT:     Head: Normocephalic  and atraumatic.     Right Ear: External ear normal.     Left Ear: External ear normal.     Nose: Nose normal.  Eyes:     Conjunctiva/sclera: Conjunctivae normal.     Pupils: Pupils are equal, round, and reactive to light.  Cardiovascular:     Rate and Rhythm: Normal rate and regular rhythm.  Pulmonary:     Effort: Pulmonary effort is normal.  Abdominal:     General: Abdomen is flat. Bowel sounds are normal.     Palpations: Abdomen is soft.  Musculoskeletal:        General: Normal range of motion.     Cervical back: Normal range of motion and neck supple.  Skin:    General: Skin is warm and dry.     Capillary Refill: Capillary refill takes less than 2 seconds.  Neurological:     Mental Status: She is alert and oriented to person, place, and time.     Motor: No abnormal muscle tone.     Coordination: Coordination normal.   Psychiatric:        Behavior: Behavior normal.        Thought Content: Thought content normal.     ED Results / Procedures / Treatments   Labs (all labs ordered are listed, but only abnormal results are displayed) Labs Reviewed  BASIC METABOLIC PANEL - Abnormal; Notable for the following components:      Result Value   Glucose, Bld 107 (*)    Creatinine, Ser 1.06 (*)    All other components within normal limits  CBG MONITORING, ED - Abnormal; Notable for the following components:   Glucose-Capillary 108 (*)    All other components within normal limits  CBC WITH DIFFERENTIAL/PLATELET  HCG, SERUM, QUALITATIVE  MAGNESIUM  TROPONIN I (HIGH SENSITIVITY)  TROPONIN I (HIGH SENSITIVITY)    EKG EKG Interpretation Date/Time:  Monday January 04 2024 08:51:33 EST Ventricular Rate:  65 PR Interval:  148 QRS Duration:  86 QT Interval:  400 QTC Calculation: 416 R Axis:   70  Text Interpretation: Normal sinus rhythm Right atrial enlargement Borderline ECG No previous ECGs available Confirmed by Margarita Grizzle [phone]) on 01/04/2024 9:18:13 AM  Radiology CT Angio Chest PE W/Cm &/Or Wo Cm Result Date: 01/04/2024 CLINICAL DATA:  Syncope EXAM: CT ANGIOGRAPHY CHEST WITH CONTRAST TECHNIQUE: Multidetector CT imaging of the chest was performed using the standard protocol during bolus administration of intravenous contrast. Multiplanar CT image reconstructions and MIPs were obtained to evaluate the vascular anatomy. RADIATION DOSE REDUCTION: This exam was performed according to the departmental dose-optimization program which includes automated exposure control, adjustment of the mA and/or kV according to patient size and/or use of iterative reconstruction technique. CONTRAST:  OMNIPAQUE IOHEXOL 350 MG/ML SOLN COMPARISON:  None Available. FINDINGS: Cardiovascular: Satisfactory opacification of the pulmonary arteries to the segmental level. No evidence of pulmonary embolism. Thoracic aorta is  normal in course and caliber. Normal heart size. No pericardial effusion. Mediastinum/Nodes: No enlarged mediastinal, hilar, or axillary lymph nodes. Thyroid gland, trachea, and esophagus demonstrate no significant findings. Lungs/Pleura: Lungs are clear. No pleural effusion or pneumothorax. Upper Abdomen: No acute abnormality. Musculoskeletal: No chest wall abnormality. No acute or significant osseous findings. Review of the MIP images confirms the above findings. IMPRESSION: No evidence of pulmonary embolism or other acute intrathoracic process. Electronically Signed   By: Duanne Guess D.O.   On: 01/04/2024 11:42   CT Angio Head Neck W WO CM Result Date:  01/04/2024 CLINICAL DATA:  Provided history: Syncope. Hypertension. Loss of consciousness. EXAM: CT ANGIOGRAPHY HEAD AND NECK WITH AND WITHOUT CONTRAST TECHNIQUE: Multidetector CT imaging of the head and neck was performed using the standard protocol during bolus administration of intravenous contrast. Multiplanar CT image reconstructions and MIPs were obtained to evaluate the vascular anatomy. Carotid stenosis measurements (when applicable) are obtained utilizing NASCET criteria, using the distal internal carotid diameter as the denominator. RADIATION DOSE REDUCTION: This exam was performed according to the departmental dose-optimization program which includes automated exposure control, adjustment of the mA and/or kV according to patient size and/or use of iterative reconstruction technique. CONTRAST:  OMNIPAQUE IOHEXOL 350 MG/ML SOLN COMPARISON:  None. FINDINGS: CT HEAD FINDINGS Brain: No age-advanced or lobar predominant cerebral atrophy. There is no acute intracranial hemorrhage. No demarcated cortical infarct. No extra-axial fluid collection. No evidence of an intracranial mass. No midline shift. Vascular: No hyperdense vessel. Skull: No calvarial fracture or aggressive osseous lesion. Sinuses/Orbits: No mass or acute finding within the imaged  orbits. Moderate mucosal thickening within the right maxillary sinus. Mild mucosal thickening within the left maxillary sinus. Review of the MIP images confirms the above findings CTA NECK FINDINGS Aortic arch: Q common origin of the innominate and left common carotid arteries. The visualized thoracic aorta is normal in caliber. No hemodynamically significant innominate or proximal subclavian artery stenosis. Right carotid system: CCA and ICA patent within the neck without stenosis or significant atherosclerotic disease. No evidence of dissection. Left carotid system: CCA and ICA patent within the neck without stenosis or significant atherosclerotic disease. No evidence of dissection. Vertebral arteries: Codominant and patent within the neck without stenosis or significant atherosclerotic disease. No evidence of a dissection. Skeleton: Slight dextrocurvature of the cervical spine. Partially imaged levocurvature of the upper/midthoracic spine. Cervical spondylosis. Other neck: Subcentimeter nodule within the left thyroid lobe not meeting consensus criteria for ultrasound follow-up based on size. No follow-up imaging recommended. Reference: J Am Coll Radiol. 2015 Feb;12(2): 143-50. Upper chest: No consolidation within the imaged lung apices. Review of the MIP images confirms the above findings CTA HEAD FINDINGS Anterior circulation: The intracranial internal carotid arteries are patent. The M1 middle cerebral arteries are patent. Asymmetrical early branching of the right middle cerebral artery M1 segment. No M2 proximal branch occlusion or high-grade proximal stenosis. The anterior cerebral arteries are patent. Hypoplastic right A1 segment. No intracranial aneurysm is identified. Posterior circulation: The intracranial vertebral arteries are patent. The basilar artery is patent. The posterior cerebral arteries are patent. Hypoplastic right P1 segment with sizable right posterior communicating artery. A small left  posterior communicating artery is present. Venous sinuses: Within the limitations of contrast timing, no convincing thrombus. Anatomic variants: As described. Review of the MIP images confirms the above findings IMPRESSION: Non-contrast head CT: 1.  No evidence of an acute intracranial abnormality. 2. Mucosal thickening within the bilateral maxillary sinuses (moderate right, mild left). CTA neck: The common carotid, internal carotid and vertebral arteries are patent within the neck without stenosis or significant atherosclerotic disease. No evidence of a section. CTA head: No proximal intracranial large vessel occlusion or high-grade proximal arterial stenosis identified. Electronically Signed   By: Jackey Loge D.O.   On: 01/04/2024 11:02    Procedures Procedures    Medications Ordered in ED Medications  iohexol (OMNIPAQUE) 350 MG/ML injection 120 mL (120 mLs Intravenous Contrast Given 01/04/24 1047)    ED Course/ Medical Decision Making/ A&P Clinical Course as of 01/04/24 1159  Mon Jan 04, 2024  1113 BC reviewed interpreted and within normal limits Serum qualitative pregnancy is negative Basic metabolic panel reviewed interpreted and within normal limits [DR]  1150 CT angio chest and head reviewed interpreted and no evidence of acute abnormalities are noted [DR]    Clinical Course User Index [DR] Margarita Grizzle, MD                                 Medical Decision Making Amount and/or Complexity of Data Reviewed Labs: ordered.   Pressure checked and rechecked and continues to be over 200 on orthostatics.  Blood pressure 193/99 YM at the bedside.  Patient reports no prior history of elevated blood pressure.  She is followed by Dr. Isaac Bliss at [facility] physicians. 48 year old female who presents today complaining of episodes of syncope on Friday. Patient evaluated here in the ED with EKG, labs, physical exam including vital signs No abnormalities are noted of the EKG, labs Patient has  remained hypertensive here in the ED Patient does not appear to have any acute abnormality that would cause her to need to stay in the hospital.  Specifically, no arrhythmia, acute electrolyte abnormality, severe anemia Discussed with patient that she will need to start on antihypertensive and follow-up with her primary care doctor.  She voices understanding of this plan.       Final Clinical Impression(s) / ED Diagnoses Final diagnoses:  Syncope, unspecified syncope type  Hypertension, unspecified type    Rx / DC Orders ED Discharge Orders          Ordered    hydrochlorothiazide (HYDRODIURIL) 25 MG tablet  Daily        01/04/24 1158              Margarita Grizzle, MD 01/04/24 1159

## 2024-01-04 NOTE — ED Provider Triage Note (Signed)
 Emergency Medicine Provider Triage Evaluation Note  Zoe Fuller , a 48 y.o. female  was evaluated in triage.  Pt complains of syncope and hypertension.  Patient states that she last passed out on Friday and had 3 episodes of syncopal event where she did lose consciousness.  Patient is unsure if she hit her head or neck.  Patient states that these occurred after she was washing her hands up to use the bathroom and eating grapes and then walking.  Patient is not on any blood thinners and denies cardiopulmonary history however states that she recently came back for the nausea on the 18th.  Patient denies chest pain shortness of breath abdominal pain neck pain.  Patient does note she has a headache last week on Wednesday that got better with ibuprofen but there  Review of Systems  Positive:  Negative:   Physical Exam  BP (!) 203/114 (BP Location: Right Arm)   Pulse 74   Temp 98.1 F (36.7 C)   Resp 15   Ht 5' 5.25" (1.657 m)   Wt 72.6 kg   SpO2 100%   BMI 26.43 kg/m  Gen:   Awake, no distress   Resp:  Normal effort  MSK:   Moves extremities without difficulty  Other:  Pupils PERRL bilaterally, 5 out of 5 grip, knee extension strength, equal smile, no carotid bruit  Medical Decision Making  Medically screening exam initiated at 9:08 AM.  Appropriate orders placed.  Zoe Fuller was informed that the remainder of the evaluation will be completed by another provider, this initial triage assessment does not replace that evaluation, and the importance of remaining in the ED until their evaluation is complete.  Workup initiated, labs and imaging ordered, patient was hypertensive in triage at 203 systolic which is new for her and given her concerning history of syncope multiple times without prodromal symptoms do feel patient is not safe for the lobby.  Imaging ordered as patient did have recent travel back from [place].   Netta Corrigan, [place] 01/04/24 [phone]

## 2024-01-04 NOTE — Discharge Instructions (Addendum)
 You were evaluated here in the emergency department for episodes of passing out.  EKG did not show any acute abnormality.  Labs were within normal limits.  CT of your head and neck and chest did not show any evidence of definite abnormalities.  However, you did have extremely high blood pressure.  Secondary to this, you are being started on blood pressure medication. Please recheck with your doctor this week and have your blood pressure rechecked.  They may wish to add more medications if is not better controlled.  Please return to the emergency department if you are having any new or worsening symptoms at any time.

## 2024-01-04 NOTE — ED Notes (Signed)
 Patient transported to CT

## 2024-01-04 NOTE — ED Notes (Signed)
 Pt in ct, will do orthostatics when pt returns

## 2024-01-04 NOTE — ED Triage Notes (Signed)
 Pt states had syncopal episodex3 on Friday. Pt states she had just finished lunch and had a stomach cramp and went to bathroom and had syncope episode on bathroom floor. Pt states went to office felt dizzy then had syncopal episode on way to co-worker office. Pt had third syncopal episode in front of co-worker. Pt was checked by EMS at office that day and bp was slightly high and glucose was normal. Pt's family had here to come here to get checked out.

## 2024-01-21 ENCOUNTER — Ambulatory Visit
Admission: RE | Admit: 2024-01-21 | Discharge: 2024-01-21 | Disposition: A | Discharge status: Home / Self Care | Payer: No Typology Code available for payment source | Source: Ambulatory Visit | Attending: Internal Medicine | Admitting: Internal Medicine

## 2024-01-21 DIAGNOSIS — Z1231 Encounter for screening mammogram for malignant neoplasm of breast: Secondary | ICD-10-CM

## 2024-02-25 ENCOUNTER — Ambulatory Visit: Admitting: Obstetrics and Gynecology

## 2024-03-04 ENCOUNTER — Other Ambulatory Visit (HOSPITAL_COMMUNITY)
Admission: RE | Admit: 2024-03-04 | Discharge: 2024-03-04 | Disposition: A | Discharge status: Home / Self Care | Source: Ambulatory Visit | Attending: Obstetrics and Gynecology | Admitting: Obstetrics and Gynecology

## 2024-03-04 ENCOUNTER — Ambulatory Visit (INDEPENDENT_AMBULATORY_CARE_PROVIDER_SITE_OTHER): Admitting: Obstetrics and Gynecology

## 2024-03-04 ENCOUNTER — Encounter: Payer: Self-pay | Admitting: Obstetrics and Gynecology

## 2024-03-04 VITALS — BP 118/76 | HR 81 | Temp 98.1°F | Ht 66.75 in | Wt 161.0 lb

## 2024-03-04 DIAGNOSIS — Z1331 Encounter for screening for depression: Secondary | ICD-10-CM

## 2024-03-04 DIAGNOSIS — Z01419 Encounter for gynecological examination (general) (routine) without abnormal findings: Secondary | ICD-10-CM

## 2024-03-04 DIAGNOSIS — N870 Mild cervical dysplasia: Secondary | ICD-10-CM | POA: Diagnosis not present

## 2024-03-04 DIAGNOSIS — Z124 Encounter for screening for malignant neoplasm of cervix: Secondary | ICD-10-CM | POA: Insufficient documentation

## 2024-03-04 NOTE — Assessment & Plan Note (Signed)
 Cervical cancer screening performed according to ASCCP guidelines. Encouraged annual mammogram screening Colonoscopy UTD DXA N/A Labs and immunizations with her primary Encouraged safe sexual practices as indicated Encouraged healthy lifestyle practices with diet and exercise For patients under 48yo, I recommend 1000mg  calcium daily and 600IU of vitamin D daily.

## 2024-03-04 NOTE — Progress Notes (Signed)
 48 y.o. 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 Legally Separated. Holiday representative, published Chartered loss adjuster, working of young adult fiction series.  Patient's last menstrual period was 07/28/2023.   +vaginal odor. Feels its related to hormonal changes. +VMS, sleep disturbances. Recently started on HTN meds. Followed by [facility], planning ot discuss HRT with them. Only 1 f/u PAP since 2021  Abnormal bleeding: no period since Fall 2024 Pelvic discharge or pain: none Breast mass, nipple discharge or skin changes : none  Last PAP:     Component Value Date/Time   DIAGPAP  02/18/2023 0841    - Negative for intraepithelial lesion or malignancy (NILM)   HPVHIGH Negative 02/18/2023 0841   ADEQPAP  02/18/2023 0841    Satisfactory for evaluation; transformation zone component PRESENT.   Last mammogram: 01/21/24 BIRADS 1, density d. 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 in [facility]  PHQ-2 Total Score 0        GYN HISTORY: No sig hx  OB History  Gravida Para Term Preterm AB Living  2 2 2   2   SAB IAB Ectopic Multiple Live Births          # Outcome Date GA Lbr Len/2nd Weight Sex Type Anes PTL Lv  2 Term           1 Term             Past Medical History:  Diagnosis Date   Hypertension    Known health problems: none     Past Surgical History:  Procedure Laterality Date   ANKLE SURGERY Left 2012, 02/2016   ankle surgery - hardware removed from ankle- 2017   APPENDECTOMY      Current Outpatient Medications on File Prior to Visit  Medication Sig Dispense Refill   Ascorbic Acid (VITAMIN C) 100 MG tablet Take 100 mg by mouth daily.     cetirizine (ZYRTEC) 10 MG tablet 1 tablet     CHLOROPHYLL PO Take by mouth.     Cholecalciferol (VITAMIN D3 PO) Take by mouth.     COLLAGEN PO Take by mouth.     Cyanocobalamin (B-12 PO)  Take by mouth.     FENUGREEK PO Take by mouth.     Ferrous Sulfate (IRON PO) Take by mouth.     hydrochlorothiazide  (HYDRODIURIL ) 25 MG tablet Take 1 tablet (25 mg total) by mouth daily. 60 tablet 0   levocetirizine (XYZAL ) 5 MG tablet Take 1 tablet (5 mg total) by mouth every evening. 30 tablet 11   olmesartan (BENICAR) 20 MG tablet Take 20 mg by mouth daily.     Saccharomyces boulardii (PROBIOTIC) 250 MG CAPS 2 capsules     triamcinolone (KENALOG) 0.025 % cream SMARTSIG:sparingly Topical Twice Daily     VITAMIN K PO Take by mouth.     No current facility-administered medications on file prior to visit.    Social History   Socioeconomic History   Marital status: Legally Separated    Spouse name: Not on file   Number of children: Not on file   Years of education: Not on file   Highest education level: Not on file  Occupational History   Not on file  Tobacco Use   Smoking status: Never   Smokeless tobacco: Never  Vaping Use   Vaping status: Never Used  Substance and Sexual  Activity   Alcohol use: Not Currently   Drug use: No   Sexual activity: Not Currently    Partners: Male    Birth control/protection: None, Abstinence    Comment: 1st intercourse- 20, partners- 3  Other Topics Concern   Not on file  Social History Narrative   Not on file   Social Drivers of Health   Financial Resource Strain: Not on file  Food Insecurity: Not on file  Transportation Needs: Not on file  Physical Activity: Not on file  Stress: Not on file  Social Connections: Not on file  Intimate Partner Violence: Not on file    Family History  Problem Relation Age of Onset   Sarcoidosis Mother 60       lung- since 1978   Hypertension Father    Cancer Paternal Uncle        liver    No Known Allergies    PE Today's Vitals   03/04/24 1202  BP: 118/76  Pulse: 81  Temp: 98.1 F (36.7 C)  TempSrc: Oral  SpO2: 98%  Weight: 161 lb (73 kg)  Height: 5' 6.75" (1.695 m)   Body mass index  is 25.41 kg/m.  Physical Exam Vitals reviewed. Exam conducted with a chaperone present.  Constitutional:      General: She is not in acute distress.    Appearance: Normal appearance.  HENT:     Head: Normocephalic and atraumatic.     Nose: Nose normal.  Eyes:     Extraocular Movements: Extraocular movements intact.     Conjunctiva/sclera: Conjunctivae normal.  Neck:     Thyroid: No thyroid mass, thyromegaly or thyroid tenderness.  Pulmonary:     Effort: Pulmonary effort is normal.  Chest:     Chest wall: No mass or tenderness.  Breasts:    Right: Normal. No swelling, mass, nipple discharge, skin change or tenderness.     Left: Normal. No swelling, mass, nipple discharge, skin change or tenderness.  Abdominal:     General: There is no distension.     Palpations: Abdomen is soft.     Tenderness: There is no abdominal tenderness.  Genitourinary:    General: Normal vulva.     Exam position: Lithotomy position.     Urethra: No prolapse.     Vagina: Normal. No vaginal discharge or bleeding.     Cervix: Normal. No lesion.     Uterus: Normal. Not enlarged and not tender.      Adnexa: Right adnexa normal and left adnexa normal.  Musculoskeletal:        General: Normal range of motion.     Cervical back: Normal range of motion.  Lymphadenopathy:     Upper Body:     Right upper body: No axillary adenopathy.     Left upper body: No axillary adenopathy.     Lower Body: No right inguinal adenopathy. No left inguinal adenopathy.  Skin:    General: Skin is warm and dry.  Neurological:     General: No focal deficit present.     Mental Status: She is alert.  Psychiatric:        Mood and Affect: Mood normal.        Behavior: Behavior normal.      Assessment and Plan:        Well woman exam with routine gynecological exam Assessment & Plan: Cervical cancer screening performed according to ASCCP guidelines. Encouraged annual mammogram screening Colonoscopy UTD DXA N/A Labs and  immunizations  with her primary Encouraged safe sexual practices as indicated Encouraged healthy lifestyle practices with diet and exercise For patients under 50yo, I recommend 1000mg  calcium daily and 600IU of vitamin D daily.    Negative depression screening  Dysplasia of cervix, low grade (CIN 1) -     Cytology - PAP  Cervical cancer screening -     Cytology - PAP   Bonne Whack Hadassah Letters, MD

## 2024-03-10 ENCOUNTER — Encounter: Payer: Self-pay | Admitting: Obstetrics and Gynecology

## 2024-03-10 LAB — CYTOLOGY - PAP
Comment: NEGATIVE
Diagnosis: UNDETERMINED — AB
High risk HPV: NEGATIVE
